# Patient Record
Sex: Female | Born: 1985 | Race: Black or African American | Hispanic: No | Marital: Single | Smoking: Former smoker
Health system: Southern US, Community
[De-identification: ages and names within clinical notes are randomized; demographics above are authoritative.]

## PROBLEM LIST (undated history)

## (undated) DIAGNOSIS — I10 Essential (primary) hypertension: Secondary | ICD-10-CM

## (undated) HISTORY — PX: COSMETIC SURGERY: SHX468

---

## 2014-10-31 DIAGNOSIS — R87619 Unspecified abnormal cytological findings in specimens from cervix uteri: Secondary | ICD-10-CM | POA: Insufficient documentation

## 2015-04-26 ENCOUNTER — Encounter (HOSPITAL_COMMUNITY): Payer: Self-pay | Admitting: *Deleted

## 2015-04-26 ENCOUNTER — Emergency Department (HOSPITAL_COMMUNITY)
Admission: EM | Admit: 2015-04-26 | Discharge: 2015-04-26 | Disposition: A | Discharge status: Home Health | Payer: Self-pay | Attending: Emergency Medicine | Admitting: Emergency Medicine

## 2015-04-26 DIAGNOSIS — W228XXA Striking against or struck by other objects, initial encounter: Secondary | ICD-10-CM | POA: Insufficient documentation

## 2015-04-26 DIAGNOSIS — Y998 Other external cause status: Secondary | ICD-10-CM | POA: Insufficient documentation

## 2015-04-26 DIAGNOSIS — Y9289 Other specified places as the place of occurrence of the external cause: Secondary | ICD-10-CM | POA: Insufficient documentation

## 2015-04-26 DIAGNOSIS — Y9389 Activity, other specified: Secondary | ICD-10-CM | POA: Insufficient documentation

## 2015-04-26 DIAGNOSIS — S81812A Laceration without foreign body, left lower leg, initial encounter: Secondary | ICD-10-CM | POA: Insufficient documentation

## 2015-04-26 DIAGNOSIS — Z720 Tobacco use: Secondary | ICD-10-CM | POA: Insufficient documentation

## 2015-04-26 MED ORDER — LIDOCAINE-EPINEPHRINE-TETRACAINE (LET) SOLUTION
3.0000 mL | Freq: Once | NASAL | Status: AC
  Administered 2015-04-26: 3 mL via TOPICAL
  Filled 2015-04-26: qty 3

## 2015-04-26 MED ORDER — LIDOCAINE HCL (PF) 1 % IJ SOLN
INTRAMUSCULAR | Status: AC
  Filled 2015-04-26: qty 5

## 2015-04-26 MED ORDER — LIDOCAINE HCL (PF) 1 % IJ SOLN
5.0000 mL | Freq: Once | INTRAMUSCULAR | Status: AC
  Administered 2015-04-26: 5 mL
  Filled 2015-04-26: qty 5

## 2015-04-26 MED ORDER — IBUPROFEN 400 MG PO TABS
600.0000 mg | ORAL_TABLET | Freq: Once | ORAL | Status: DC
  Filled 2015-04-26: qty 2

## 2015-04-26 MED ORDER — TRAMADOL HCL 50 MG PO TABS: 50.0000 mg | ORAL_TABLET | Freq: Four times a day (QID) | ORAL | Status: DC | PRN

## 2015-04-26 MED ORDER — IBUPROFEN 600 MG PO TABS: 600.0000 mg | ORAL_TABLET | Freq: Four times a day (QID) | ORAL | Status: DC | PRN

## 2015-04-26 NOTE — ED Notes (Signed)
Let applied to leg.  Pt nevous and comfort provided.  Family member at bedside

## 2015-04-26 NOTE — ED Notes (Signed)
abx ointment applied with bulky dressing.  Pt tolerated well.

## 2015-04-26 NOTE — ED Provider Notes (Signed)
CSN: [id number]     Arrival date & time 04/26/15  1356 History   First MD Initiated Contact with Patient 04/26/15 1559     Chief Complaint  Patient presents with  . Leg Injury     (Consider location/radiation/quality/duration/timing/severity/associated sxs/prior Treatment) HPI  Pt is a 29yo female presenting to ED with c/o laceration to Left lower leg after hitting it on the side of bed rail.  Pt states incident occurred about 2 hours PTA.  Bleeding controlled PTA.  No pain medication PTA, pain is aching and stinging, 6/10 at worst, 4/10 at this time. No other injuries. Tdap is UTD.  History reviewed. No pertinent past medical history. Past Surgical History  Procedure Laterality Date  . Cesarean section     No family history on file. History  Substance Use Topics  . Smoking status: Current Some Day Smoker  . Smokeless tobacco: Not on file  . Alcohol Use: No   OB History    No data available     Review of Systems  Musculoskeletal: Negative for myalgias and arthralgias.  Skin: Positive for wound ( laceration).       Left anterior lower leg  Neurological: Negative for weakness and numbness.  All other systems reviewed and are negative.     Allergies  Review of patient's allergies indicates no known allergies.  Home Medications   Prior to Admission medications   Medication Sig Start Date End Date Taking? Authorizing Provider  ibuprofen (ADVIL,MOTRIN) 600 MG tablet Take 1 tablet (600 mg total) by mouth every 6 (six) hours as needed. 04/26/15   Junius Finner, PA-C  traMADol (ULTRAM) 50 MG tablet Take 1 tablet (50 mg total) by mouth every 6 (six) hours as needed. 04/26/15   Junius Finner, PA-C   BP 116/65 mmHg  Pulse 61  Temp(Src) 98.8 F (37.1 C) (Oral)  Resp 22  SpO2 100%  LMP 04/22/2015 Physical Exam  Constitutional: She is oriented to person, place, and time. She appears well-developed and well-nourished.  HENT:  Head: Normocephalic and atraumatic.  Eyes: EOM  are normal.  Neck: Normal range of motion.  Cardiovascular: Normal rate.   Pulses:      Dorsalis pedis pulses are 2+ on the left side.  Pulmonary/Chest: Effort normal.  Musculoskeletal: Normal range of motion.  Left leg: FROM, mild tenderness around laceration (see skin exam) no calf tenderness. Compartment is soft  Neurological: She is alert and oriented to person, place, and time.  Left lower leg: sensation in tact  Skin: Skin is warm and dry.  Left lower leg, anterior aspect: 6cm vertical laceration, superficial but gaping. Scant red blood. No foreign bodies.   Psychiatric: She has a normal mood and affect. Her behavior is normal.  Nursing note and vitals reviewed.   ED Course  Procedures   LACERATION REPAIR Performed by: Junius Finner A. Authorized by: Ina Homes Consent: Verbal consent obtained. Risks and benefits: risks, benefits and alternatives were discussed Consent given by: patient Patient identity confirmed: provided demographic data Prepped and Draped in normal sterile fashion Wound explored  Laceration Location: Left anterior lower leg  Laceration Length: 6cm  No Foreign Bodies seen or palpated  Anesthesia: topical and local infiltration  Local anesthetic: LET and lidocaine 1% without epinephrine  Anesthetic total: 3 ml  Irrigation method: syringe Amount of cleaning: standard  Skin closure: 3-0 Prolene  Number of sutures: 7  Technique: interrupted  Patient tolerance: Patient tolerated the procedure well with no immediate complications.  Labs Review Labs Reviewed - No data to display  Imaging Review No results found.   EKG Interpretation None      MDM   Final diagnoses:  Leg laceration, left, initial encounter   Pt is a 29yo female presenting to ED with laceration to Left anterior leg 2 hours PTA. Tetanus UTD. Left lower extremity is neurovascularly in tact. Laceration repaired with 7 sutures. No immediate complications. Home  care instructions provided. Rx: tramadol and ibuprofen. Advised to f/u in 10-14 days for suture removal. Return precautions provided. Pt verbalized understanding and agreement with tx plan.    Junius Finnerrin O'Malley, PA-C 04/26/15 1926  Lorre NickAnthony Allen, MD 04/26/15 [phone]

## 2015-04-26 NOTE — ED Notes (Signed)
Pt is here with left leg injury that happened 2 hours ago and states she ran into a bed rail and it split her leg open. Looks superficial and last tetanus last year

## 2015-05-05 ENCOUNTER — Emergency Department (HOSPITAL_COMMUNITY)
Admission: EM | Admit: 2015-05-05 | Discharge: 2015-05-05 | Disposition: A | Discharge status: Home / Self Care | Payer: Self-pay | Attending: Emergency Medicine | Admitting: Emergency Medicine

## 2015-05-05 ENCOUNTER — Encounter (HOSPITAL_COMMUNITY): Payer: Self-pay | Admitting: *Deleted

## 2015-05-05 DIAGNOSIS — Z4802 Encounter for removal of sutures: Secondary | ICD-10-CM | POA: Insufficient documentation

## 2015-05-05 DIAGNOSIS — Z720 Tobacco use: Secondary | ICD-10-CM | POA: Insufficient documentation

## 2015-05-05 NOTE — ED Provider Notes (Addendum)
CSN: [id number]     Arrival date & time 05/05/15  1426 History  This chart was scribed for non-physician practitioner, [facility] Damian LeavellNeese, NP working with Pricilla LovelessScott Goldston, MD by Doreatha MartinEva Mathews, ED scribe. This patient was seen in room TR07C/TR07C and the patient's care was started at 2:34 PM    Chief Complaint  Patient presents with  . Suture / Staple Removal   Patient is a 29 y.o. female presenting with suture removal. The history is provided by the patient. No language interpreter was used.  Suture / Staple Removal This is a new problem. The current episode started more than 1 week ago. The problem occurs constantly. The problem has been resolved. Pertinent negatives include no abdominal pain and no headaches. Nothing aggravates the symptoms. Nothing relieves the symptoms. She has tried nothing for the symptoms. The treatment provided moderate relief.    HPI Comments: Norma Sullivan is a 29 y.o. female who presents to the Emergency Department for suture removal. Pt has 7 sutures on the left lower leg that have been in place for 11 days. Pt denies any other injuries or new symptoms. Pt also denies pain, erythema, drainage and swelling.   History reviewed. No pertinent past medical history. Past Surgical History  Procedure Laterality Date  . Cesarean section     History reviewed. No pertinent family history. History  Substance Use Topics  . Smoking status: Current Some Day Smoker  . Smokeless tobacco: Not on file  . Alcohol Use: No   OB History    No data available     Review of Systems  Gastrointestinal: Negative for abdominal pain.  Musculoskeletal: Negative for arthralgias.  Skin: Positive for wound. Negative for color change.  Neurological: Negative for headaches.  All other systems reviewed and are negative.  Allergies  Review of patient's allergies indicates no known allergies.  Home Medications   Prior to Admission medications   Medication Sig Start Date End Date  Taking? Authorizing Provider  ibuprofen (ADVIL,MOTRIN) 600 MG tablet Take 1 tablet (600 mg total) by mouth every 6 (six) hours as needed. 04/26/15   Junius FinnerErin O'Malley, PA-C  traMADol (ULTRAM) 50 MG tablet Take 1 tablet (50 mg total) by mouth every 6 (six) hours as needed. 04/26/15   Junius FinnerErin O'Malley, PA-C   BP 123/66 mmHg  Pulse 66  Temp(Src) 98.1 F (36.7 C) (Oral)  Resp 20  SpO2 100%  LMP 04/22/2015 Physical Exam  Constitutional: She is oriented to person, place, and time. She appears well-developed and well-nourished. No distress.  HENT:  Head: Normocephalic and atraumatic.  Mouth/Throat: Oropharynx is clear and moist.  Eyes: EOM are normal.  Neck: Normal range of motion. Neck supple. No tracheal deviation present.  Cardiovascular: Normal rate.   Pulmonary/Chest: Effort normal. No respiratory distress.  Musculoskeletal: Normal range of motion. She exhibits no edema or tenderness.       Legs: Sutures in place left lower leg without signs of infection.  Neurological: She is alert and oriented to person, place, and time.  Skin: Skin is warm and dry. No erythema.  No erythema no drainage. No signs of infection. Healing well.    Psychiatric: She has a normal mood and affect. Her behavior is normal.  Nursing note and vitals reviewed.   ED Course  Procedures (including critical care time)  Sutures removed by me.   DIAGNOSTIC STUDIES: Oxygen Saturation is 100% on RA, normal by my interpretation.    COORDINATION OF CARE: 2:45 PM Discussed treatment plan  with pt at bedside which includes suture removal and pt agreed to plan.   MDM  29 y.o. female here for suture removal. Stable for d/c without signs of infection.  Final diagnoses:  Visit for suture removal   I personally performed the services described in this documentation, which was scribed in my presence. The recorded information has been reviewed and is accurate.    [facility] Orlene Och, NP 05/05/15 [address] [place], [place] 05/12/15  1610  Pricilla Loveless, MD 05/13/15 [phone]

## 2015-05-05 NOTE — ED Notes (Signed)
7 sutures removed from LT anterior leg.

## 2015-05-05 NOTE — ED Notes (Signed)
Pt returns to Ed for stitch removal on Lt lower leg.

## 2015-05-05 NOTE — ED Notes (Signed)
Declined W/C at D/C and was escorted to lobby by RN. 

## 2015-05-05 NOTE — Discharge Instructions (Signed)

## 2015-09-17 ENCOUNTER — Emergency Department (HOSPITAL_COMMUNITY)
Admission: EM | Admit: 2015-09-17 | Discharge: 2015-09-17 | Disposition: A | Discharge status: Home / Self Care | Payer: Worker's Compensation | Attending: Emergency Medicine | Admitting: Emergency Medicine

## 2015-09-17 ENCOUNTER — Emergency Department (HOSPITAL_COMMUNITY): Payer: Self-pay

## 2015-09-17 ENCOUNTER — Encounter (HOSPITAL_COMMUNITY): Payer: Self-pay | Admitting: Neurology

## 2015-09-17 DIAGNOSIS — Y990 Civilian activity done for income or pay: Secondary | ICD-10-CM | POA: Insufficient documentation

## 2015-09-17 DIAGNOSIS — Y9289 Other specified places as the place of occurrence of the external cause: Secondary | ICD-10-CM | POA: Insufficient documentation

## 2015-09-17 DIAGNOSIS — W1840XA Slipping, tripping and stumbling without falling, unspecified, initial encounter: Secondary | ICD-10-CM | POA: Insufficient documentation

## 2015-09-17 DIAGNOSIS — Y9389 Activity, other specified: Secondary | ICD-10-CM | POA: Insufficient documentation

## 2015-09-17 DIAGNOSIS — S8012XA Contusion of left lower leg, initial encounter: Secondary | ICD-10-CM | POA: Insufficient documentation

## 2015-09-17 DIAGNOSIS — Z720 Tobacco use: Secondary | ICD-10-CM | POA: Insufficient documentation

## 2015-09-17 DIAGNOSIS — S80812A Abrasion, left lower leg, initial encounter: Secondary | ICD-10-CM | POA: Insufficient documentation

## 2015-09-17 MED ORDER — IBUPROFEN 600 MG PO TABS: 600.0000 mg | ORAL_TABLET | Freq: Four times a day (QID) | ORAL | Status: DC | PRN

## 2015-09-17 NOTE — ED Provider Notes (Signed)
CSN: [id number]     Arrival date & time 09/17/15  1556 History   First MD Initiated Contact with Patient 09/17/15 1637     Chief Complaint  Patient presents with  . Leg Pain     (Consider location/radiation/quality/duration/timing/severity/associated sxs/prior Treatment) HPI Norma Sullivan is a 29 y.o. female who presents to emergency department after a left leg injury. Patient states she was at work, and slipped on the washer fluid, and states her leg went under medical equipment. She reports swelling to the leg after the injury and a small abrasion. She states she is having pain with ambulating, states cannot put any weight on her leg. Patient states she put ice on her leg and elevated her leg to get the swelling down, and states she thinks it is improving. She also took ibuprofen prior to coming in. The pain continues and she is unable to ambulate secondary to emergency department for further evaluation. Denies any numbness or weakness distal to the injury.  History reviewed. No pertinent past medical history. Past Surgical History  Procedure Laterality Date  . Cesarean section     No family history on file. Social History  Substance Use Topics  . Smoking status: Current Some Day Smoker  . Smokeless tobacco: None  . Alcohol Use: No   OB History    No data available     Review of Systems  Constitutional: Negative for fever and chills.  Respiratory: Negative for cough, chest tightness and shortness of breath.   Cardiovascular: Negative for chest pain, palpitations and leg swelling.  Musculoskeletal: Positive for arthralgias. Negative for myalgias, neck pain and neck stiffness.  Skin: Positive for wound. Negative for rash.  Neurological: Negative for weakness and numbness.  All other systems reviewed and are negative.     Allergies  Review of patient's allergies indicates no known allergies.  Home Medications   Prior to Admission medications   Medication Sig  Start Date End Date Taking? Authorizing Provider  ibuprofen (ADVIL,MOTRIN) 600 MG tablet Take 1 tablet (600 mg total) by mouth every 6 (six) hours as needed. 04/26/15   Junius FinnerErin O'Malley, PA-C  traMADol (ULTRAM) 50 MG tablet Take 1 tablet (50 mg total) by mouth every 6 (six) hours as needed. 04/26/15   Junius FinnerErin O'Malley, PA-C   BP 121/64 mmHg  Pulse 73  Temp(Src) 98 F (36.7 C) (Oral)  Resp 14  SpO2 100%  LMP 08/22/2015 Physical Exam  Constitutional: She is oriented to person, place, and time. She appears well-developed and well-nourished. No distress.  HENT:  Head: Normocephalic.  Eyes: Conjunctivae are normal.  Neck: Neck supple.  Cardiovascular: Normal rate, regular rhythm and normal heart sounds.   Pulmonary/Chest: Effort normal and breath sounds normal. No respiratory distress. She has no wheezes. She has no rales.  Musculoskeletal: She exhibits no edema.  Small superficial abrasion to the distal anterior left shin. There is mild swelling around the abrasion. Tender to palpation diffusely over anterior shin. Unable to bear weight on left leg. Full range of motion of the knee and ankle. Intact. Compartments are soft.  Neurological: She is alert and oriented to person, place, and time.  Skin: Skin is warm and dry.  Psychiatric: She has a normal mood and affect. Her behavior is normal.  Nursing note and vitals reviewed.   ED Course  Procedures (including critical care time) Labs Review Labs Reviewed - No data to display  Imaging Review Dg Tibia/fibula Left  09/17/2015  CLINICAL DATA:  Anterior left  tibial pain and abrasion following fall at work. EXAM: LEFT TIBIA AND FIBULA - 2 VIEW COMPARISON:  None. FINDINGS: No fracture or dislocation. Limited visualization of the adjacent knee and ankle is normal given obliquity and large field of view. Regional soft tissues appear normal. No radiopaque foreign body. IMPRESSION: No acute findings. Electronically Signed   By: Simonne Come M.D.   On:  09/17/2015 18:01   I have personally reviewed and evaluated these images and lab results as part of my medical decision-making.   EKG Interpretation None      MDM   Final diagnoses:  Contusion of left lower leg, initial encounter    Patient with contusion to the left lower leg. X-rays negative. Small abrasion and tiny contusion noted to the shin. No obvious swelling. Full range of motion of the knee and ankle. Most likely deep contusion. No evidence of compartment syndrome. Will discharge home with ibuprofen, ice and elevation, follow up as needed.  Filed Vitals:   09/17/15 1626  BP: 121/64  Pulse: 73  Temp: 98 F (36.7 C)  TempSrc: Oral  Resp: 14  SpO2: 100%       Jaynie Crumble, PA-C 09/17/15 1809  Lorre Nick, MD 09/18/15 [phone]

## 2015-09-17 NOTE — Discharge Instructions (Signed)
Ice and elevate your leg at home. Ibuprofen for pain. Follow up as needed.    Contusion A contusion is a deep bruise. Contusions are the result of a blunt injury to tissues and muscle fibers under the skin. The injury causes bleeding under the skin. The skin overlying the contusion may turn blue, purple, or yellow. Minor injuries will give you a painless contusion, but more severe contusions may stay painful and swollen for a few weeks.  CAUSES  This condition is usually caused by a blow, trauma, or direct force to an area of the body. SYMPTOMS  Symptoms of this condition include:  Swelling of the injured area.  Pain and tenderness in the injured area.  Discoloration. The area may have redness and then turn blue, purple, or yellow. DIAGNOSIS  This condition is diagnosed based on a physical exam and medical history. An X-ray, CT scan, or MRI may be needed to determine if there are any associated injuries, such as broken bones (fractures). TREATMENT  Specific treatment for this condition depends on what area of the body was injured. In general, the best treatment for a contusion is resting, icing, applying pressure to (compression), and elevating the injured area. This is often called the RICE strategy. Over-the-counter anti-inflammatory medicines may also be recommended for pain control.  HOME CARE INSTRUCTIONS   Rest the injured area.  If directed, apply ice to the injured area:  Put ice in a plastic bag.  Place a towel between your skin and the bag.  Leave the ice on for 20 minutes, 2-3 times per day.  If directed, apply light compression to the injured area using an elastic bandage. Make sure the bandage is not wrapped too tightly. Remove and reapply the bandage as directed by your health care provider.  If possible, raise (elevate) the injured area above the level of your heart while you are sitting or lying down.  Take over-the-counter and prescription medicines only as told by  your health care provider. SEEK MEDICAL CARE IF:  Your symptoms do not improve after several days of treatment.  Your symptoms get worse.  You have difficulty moving the injured area. SEEK IMMEDIATE MEDICAL CARE IF:   You have severe pain.  You have numbness in a hand or foot.  Your hand or foot turns pale or cold.   This information is not intended to replace advice given to you by your health care provider. Make sure you discuss any questions you have with your health care provider.   Document Released: 08/24/2005 Document Revised: 08/05/2015 Document Reviewed: 04/01/2015 Elsevier Interactive Patient Education [employer]2016 Elsevier Inc.

## 2015-09-17 NOTE — ED Notes (Signed)
Pt reports was at work, tripped and hit her lower left leg on a metal walkway. Superficial abrasion noted. Is ambulatory.

## 2015-09-17 NOTE — ED Notes (Signed)
Pt stable, ambulatory, states understanding of discharge instructions 

## 2016-03-25 ENCOUNTER — Emergency Department (HOSPITAL_COMMUNITY): Payer: PRIVATE HEALTH INSURANCE

## 2016-03-25 ENCOUNTER — Encounter (HOSPITAL_COMMUNITY): Payer: Self-pay | Admitting: Emergency Medicine

## 2016-03-25 ENCOUNTER — Emergency Department (HOSPITAL_COMMUNITY)
Admission: EM | Admit: 2016-03-25 | Discharge: 2016-03-25 | Disposition: A | Discharge status: Home / Self Care | Payer: PRIVATE HEALTH INSURANCE | Attending: Emergency Medicine | Admitting: Emergency Medicine

## 2016-03-25 DIAGNOSIS — Y9289 Other specified places as the place of occurrence of the external cause: Secondary | ICD-10-CM | POA: Insufficient documentation

## 2016-03-25 DIAGNOSIS — Y990 Civilian activity done for income or pay: Secondary | ICD-10-CM | POA: Diagnosis not present

## 2016-03-25 DIAGNOSIS — S61312A Laceration without foreign body of right middle finger with damage to nail, initial encounter: Secondary | ICD-10-CM | POA: Diagnosis not present

## 2016-03-25 DIAGNOSIS — S6991XA Unspecified injury of right wrist, hand and finger(s), initial encounter: Secondary | ICD-10-CM | POA: Diagnosis present

## 2016-03-25 DIAGNOSIS — Y9389 Activity, other specified: Secondary | ICD-10-CM | POA: Diagnosis not present

## 2016-03-25 DIAGNOSIS — S60131A Contusion of right middle finger with damage to nail, initial encounter: Secondary | ICD-10-CM | POA: Diagnosis not present

## 2016-03-25 DIAGNOSIS — F17200 Nicotine dependence, unspecified, uncomplicated: Secondary | ICD-10-CM | POA: Diagnosis not present

## 2016-03-25 DIAGNOSIS — W230XXA Caught, crushed, jammed, or pinched between moving objects, initial encounter: Secondary | ICD-10-CM | POA: Insufficient documentation

## 2016-03-25 DIAGNOSIS — Z23 Encounter for immunization: Secondary | ICD-10-CM | POA: Diagnosis not present

## 2016-03-25 DIAGNOSIS — S60031A Contusion of right middle finger without damage to nail, initial encounter: Secondary | ICD-10-CM

## 2016-03-25 HISTORY — DX: Essential (primary) hypertension: I10

## 2016-03-25 MED ORDER — BUPIVACAINE HCL (PF) 0.5 % IJ SOLN
10.0000 mL | Freq: Once | INTRAMUSCULAR | Status: AC
  Administered 2016-03-25: 10 mL
  Filled 2016-03-25: qty 10

## 2016-03-25 MED ORDER — TETANUS-DIPHTH-ACELL PERTUSSIS 5-2.5-18.5 LF-MCG/0.5 IM SUSP
0.5000 mL | Freq: Once | INTRAMUSCULAR | Status: AC
  Administered 2016-03-25: 0.5 mL via INTRAMUSCULAR
  Filled 2016-03-25: qty 0.5

## 2016-03-25 MED ORDER — NAPROXEN 250 MG PO TABS
500.0000 mg | ORAL_TABLET | Freq: Once | ORAL | Status: AC
  Administered 2016-03-25: 500 mg via ORAL
  Filled 2016-03-25: qty 2

## 2016-03-25 MED ORDER — NAPROXEN 500 MG PO TABS: 500.0000 mg | ORAL_TABLET | Freq: Two times a day (BID) | ORAL | Status: DC

## 2016-03-25 NOTE — Discharge Instructions (Signed)
Please call Dr. Glenna Durand office to schedule a hand/orthopedic follow up appointment. In the meantime you may take naproxen as needed for pain. Keep the area clean with warm water and soap. Return to the ER for new or worsening symptoms.   Nail Bed Laceration Nail bed lacerations are cuts or breaks in the soft tissue under a fingernail or toenail. These injuries are usually painful and cause bleeding. They often involve damage to the nail or loss of the nail. If the nail remains in place, a nail bed laceration may result in a painful collection of blood under the nail (hematoma).  CAUSES Nail bed lacerations are commonly caused by:   Crush or pinch injuries in which the finger or toe gets caught between two objects.  A sharp cut to the fingertip or toe.  Tearing injuries (avulsions) to the tip of the finger or toe. DIAGNOSIS Your health care provider will take a medical history, ask for details about how the injury occurred, and examine the injured area. The wound will be cleaned so that the health care provider can see the extent of the injury. X-rays are sometimes done to check for broken bones. TREATMENT Treatment will depend on the severity of the laceration and the details of the injury. Most lacerations require repair with stitches (sutures). For this repair, your health care provider may:  Give you medicine to numb the injured area (local anesthetic). In some cases, the health care provider may also apply a compression bandage (tourniquet) to reduce bleeding.  Remove the nail from the nail bed.  Close the wound in the nail bed with sutures that dissolve on their own (absorbable sutures).  Reattach the nail (if intact) after repairing the nail bed. The nail may be held in place with sutures or skin glue. Sometimes, a small hole is made in the nail to allow for normal drainage of fluid or blood. If the nail is lost or too damaged, a small piece of silicone or special gauze may be put  over the nail bed.  A bandage (dressing) may be applied over the wound. A splint is sometimes used for increased protection. You may need a tetanus shot if:  You cannot remember when you had your last tetanus shot.   You have never had a tetanus shot.   The injury broke your skin. If you get a tetanus shot, your arm may swell, get red, and feel warm to the touch. This is common and not a problem. If you need a tetanus shot and you choose not to have one, there is a rare chance of getting tetanus. Sickness from tetanus can be serious. HOME CARE INSTRUCTIONS  Keep the wound clean and dry. Change or remove dressings only as directed by your health care provider. If the nail was preserved, you may be asked not to change the original dressing for 5 to 7 days. If the nail was lost, you may need to change dressings much sooner.   Gently clean the wound with soap and water before putting on a new dressing.   Apply antibiotic ointment to the wound if advised to do so by your health care provider.   Move your hand or foot normally to prevent stiffness. Your health care provider may give you specific instructions.   Only take over-the-counter or prescription medicines as directed by your health care provider. If you were prescribed antibiotics, take them as directed. Finish them even if you start to feel better.  Follow up with  your health care provider as directed. SEEK MEDICAL CARE IF:  You have redness, swelling, or increasing pain in the injured area.   You notice fluid draining from the injured site.  SEEK IMMEDIATE MEDICAL CARE IF:  Your wound splits open and starts bleeding.   Your skin around the injury is turning dark blue or black.  MAKE SURE YOU:  Understand these instructions.   Will watch your condition.   Will get help right away if you are not doing well or get worse.    This information is not intended to replace advice given to you by your health care  provider. Make sure you discuss any questions you have with your health care provider.   Document Released: 02/21/2008 Document Revised: 07/17/2013 Document Reviewed: 04/29/2013 Elsevier Interactive Patient Education [employer]2016 Elsevier Inc.

## 2016-03-25 NOTE — ED Notes (Signed)
Pt. States she was at work and her finger got stuck under weight of a cutting machine. Pt. Appears to have crush injury to end of right middle finger. Pt. Is able to move and bend all fingers at this time.

## 2016-03-25 NOTE — ED Notes (Signed)
Splint applied by emt.

## 2016-03-25 NOTE — ED Notes (Signed)
Provider at the bedside for update.

## 2016-03-25 NOTE — ED Provider Notes (Signed)
CSN: [id number]     Arrival date & time 03/25/16  1957 History  By signing my name below, I, Placido SouLogan Joldersma, attest that this documentation has been prepared under the direction and in the presence of Kiondra Caicedo Y Chrisie Jankovich, [place]-C. Electronically Signed: Placido SouLogan Joldersma, ED Scribe. 03/25/2016. 8:41 PM.   Chief Complaint  Patient presents with  . Finger Injury   The history is provided by the patient. No language interpreter was used.    HPI Comments: Norma Sullivan is a 30 y.o. female who presents to the Emergency Department complaining of a laceration with controlled bleeding to her right middle finger which occurred PTA. 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 She reports associated, moderate, pain surrounding the wound that worsens with any movement. She is unsure of the timing of her most recent tetanus vaccination. Pt denies any other associated symptoms at this time.   No past medical history on file. Past Surgical History  Procedure Laterality Date  . Cesarean section     No family history on file. Social History  Substance Use Topics  . Smoking status: Current Some Day Smoker  . Smokeless tobacco: Not on file  . Alcohol Use: No   OB History    No data available     Review of Systems A complete 10 system review of systems was obtained and all systems are negative except as noted in the HPI and PMH.   Allergies  Review of patient's allergies indicates no known allergies.  Home Medications   Prior to Admission medications   Medication Sig Start Date End Date Taking? Authorizing Provider  ibuprofen (ADVIL,MOTRIN) 600 MG tablet Take 1 tablet (600 mg total) by mouth every 6 (six) hours as needed. 04/26/15   Junius FinnerErin O'Malley, PA-C  ibuprofen (ADVIL,MOTRIN) 600 MG tablet Take 1 tablet (600 mg total) by mouth every 6 (six) hours as needed. 09/17/15   Tatyana Kirichenko, PA-C  traMADol (ULTRAM)  50 MG tablet Take 1 tablet (50 mg total) by mouth every 6 (six) hours as needed. 04/26/15   Junius FinnerErin O'Malley, PA-C   BP 134/85 mmHg  Pulse 92  Temp(Src) 98.4 F (36.9 C) (Oral)  Resp 16  SpO2 100%  LMP 02/22/2016    Physical Exam  Constitutional: She is oriented to person, place, and time. She appears well-developed and well-nourished.  HENT:  Head: Normocephalic and atraumatic.  Eyes: EOM are normal.  Neck: Normal range of motion.  Cardiovascular: Normal rate.   Pulmonary/Chest: Effort normal. No respiratory distress.  Abdominal: Soft.  Musculoskeletal: Normal range of motion.  Longitudinal laceration to dorsal distal tip of right middle finger. Laceration extends through artificial nail, natural nail, though not through volar side of hand. Some limited ROM due to pain. Intact sensation. Brisk cap refill. Bleeding well controlled. No foreign bodies visualized or palpated.  Neurological: She is alert and oriented to person, place, and time.  Skin: Skin is warm and dry.  Psychiatric: She has a normal mood and affect.  Nursing note and vitals reviewed.   ED Course  Procedures   NERVE BLOCK Performed by: Noelle PennerSerena Joh Rao Consent: Verbal consent obtained. Required items: required blood products, implants, devices, and special equipment available Time out: Immediately prior to procedure a "time out" was called to verify the correct patient, procedure, equipment, support staff and site/side marked as required.  Indication: finger lac Nerve block body site: right middle finger digital  block  Preparation: Patient was prepped and draped in the usual sterile fashion. Needle gauge: 24 G Location technique: anatomical landmarks  Local anesthetic: 0.5% marcaine without epi  Anesthetic total: 5 ml  Outcome: pain improved Patient tolerance: Patient tolerated the procedure well with no immediate complications.   DIAGNOSTIC STUDIES: Oxygen Saturation is 100% on RA, normal by my  interpretation.    COORDINATION OF CARE: 8:21 PM Discussed next steps with pt. She verbalized understanding and is agreeable with the plan.   Labs Review Labs Reviewed - No data to display  Imaging Review Dg Finger Middle Right  03/25/2016  CLINICAL DATA:  Fingertip laceration at work today. Initial encounter. EXAM: RIGHT MIDDLE FINGER 2+V COMPARISON:  None. FINDINGS: Fracture of the nail. No osseous fracture or subluxation. No opaque foreign body. IMPRESSION: No osseous abnormality or opaque foreign body. Electronically Signed   By: Marnee Spring M.D.   On: 03/25/2016 21:04   I have personally reviewed and evaluated these images as part of my medical decision-making.   EKG Interpretation None      MDM   Final diagnoses:  Contusion of right middle finger, initial encounter  Laceration of right middle finger w/o foreign body with damage to nail, initial encounter    X-ray reveals fracture of finger nail but no other acute fracture or foreign body. Laceration is through nail and will hold off on any kind of suture repair. Discussed with pt she might lose her fingernail. Tetanus updated. Pt otherwise neurovascularly intact and stable for discharge with outpatient f/u. Pt placed in finger splint and rx given for naproxen. Instructed to f/u with hand. ER return precations given.   I personally performed the services described in this documentation, which was scribed in my presence. The recorded information has been reviewed and is accurate.   Carlene Coria, PA-C 03/25/16 2147

## 2016-05-01 ENCOUNTER — Emergency Department (HOSPITAL_COMMUNITY)
Admission: EM | Admit: 2016-05-01 | Discharge: 2016-05-01 | Disposition: A | Discharge status: Home / Self Care | Payer: PRIVATE HEALTH INSURANCE | Attending: Emergency Medicine | Admitting: Emergency Medicine

## 2016-05-01 ENCOUNTER — Emergency Department (HOSPITAL_COMMUNITY): Payer: PRIVATE HEALTH INSURANCE

## 2016-05-01 ENCOUNTER — Encounter (HOSPITAL_COMMUNITY): Payer: Self-pay | Admitting: *Deleted

## 2016-05-01 DIAGNOSIS — S82891A Other fracture of right lower leg, initial encounter for closed fracture: Secondary | ICD-10-CM | POA: Diagnosis not present

## 2016-05-01 DIAGNOSIS — Y999 Unspecified external cause status: Secondary | ICD-10-CM | POA: Diagnosis not present

## 2016-05-01 DIAGNOSIS — I10 Essential (primary) hypertension: Secondary | ICD-10-CM | POA: Insufficient documentation

## 2016-05-01 DIAGNOSIS — F17200 Nicotine dependence, unspecified, uncomplicated: Secondary | ICD-10-CM | POA: Insufficient documentation

## 2016-05-01 DIAGNOSIS — S99911A Unspecified injury of right ankle, initial encounter: Secondary | ICD-10-CM | POA: Diagnosis present

## 2016-05-01 DIAGNOSIS — S93401A Sprain of unspecified ligament of right ankle, initial encounter: Secondary | ICD-10-CM

## 2016-05-01 DIAGNOSIS — Y9389 Activity, other specified: Secondary | ICD-10-CM | POA: Insufficient documentation

## 2016-05-01 DIAGNOSIS — W07XXXA Fall from chair, initial encounter: Secondary | ICD-10-CM | POA: Diagnosis not present

## 2016-05-01 DIAGNOSIS — Y929 Unspecified place or not applicable: Secondary | ICD-10-CM | POA: Insufficient documentation

## 2016-05-01 MED ORDER — IBUPROFEN 400 MG PO TABS
600.0000 mg | ORAL_TABLET | Freq: Once | ORAL | Status: AC
  Administered 2016-05-01: 600 mg via ORAL
  Filled 2016-05-01: qty 1

## 2016-05-01 MED ORDER — HYDROMORPHONE HCL 1 MG/ML IJ SOLN
1.0000 mg | Freq: Once | INTRAMUSCULAR | Status: DC
  Filled 2016-05-01: qty 1

## 2016-05-01 MED ORDER — OXYCODONE-ACETAMINOPHEN 5-325 MG PO TABS: 2.0000 | ORAL_TABLET | ORAL | Status: DC | PRN

## 2016-05-01 NOTE — ED Notes (Signed)
K Gekas, PA, in w/pt. 

## 2016-05-01 NOTE — ED Notes (Signed)
States pain increased after attempted to ambulate w/cam walker and crutches. States feels will decrease when gets home.

## 2016-05-01 NOTE — ED Notes (Signed)
Anklet removed from right ankle - given to pt.

## 2016-05-01 NOTE — ED Notes (Signed)
States fell out of a chair attempting to hang curtains injurying right ankle. Denies head injury/LOC. States Ibuprofen last pm for pain.

## 2016-05-01 NOTE — Progress Notes (Signed)
Orthopedic Tech Progress Note Patient Details:  Charlies SilversMarquaitta D Ashraf 08/08/1986 [id number]  Ortho Devices Type of Ortho Device: Crutches, CAM walker Ortho Device/Splint Interventions: Application   Saul FordyceJennifer C Aaryn Parrilla 05/01/2016, 11:04 AM

## 2016-05-01 NOTE — ED Notes (Signed)
Kids x 3 w/pt.

## 2016-05-01 NOTE — ED Provider Notes (Signed)
CSN: [id number]     Arrival date & time 05/01/16  0905 History  By signing my name below, I, Soijett Blue, attest that this documentation has been prepared under the direction and in the presence of Bethel Born, PA-C Electronically Signed: Soijett Blue, ED Scribe. 05/01/2016. 10:03 AM.   Chief Complaint  Patient presents with  . Fall    right ankle injury    The history is provided by the patient. No language interpreter was used.   Norma Sullivan is a 30 y.o. female with a medical hx of HTN who presents to the Emergency Department complaining of a fall onset last night. Pt notes that she fell out of a chair while standing on it and attempting to hang curtains onto her right ankle. Pt reports that she heard cracking during her fall. Pt denies injuring her right ankle in the past. Pt is having associated symptoms of right ankle pain, gait problem due to pain, and right ankle swelling. She notes that she has tried ibuprofen with her last dose being last night for the relief of her symptoms. She denies right knee pain, numbness, tingling, and any other symptoms. 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    Past Medical History  Diagnosis Date  . Hypertension    Past Surgical History  Procedure Laterality Date  . Cesarean section     No family history on file. Social History  Substance Use Topics  . Smoking status: Current Some Day Smoker  . Smokeless tobacco: Not on file  . Alcohol Use: No   OB History    No data available     Review of Systems  Musculoskeletal: Positive for joint swelling, arthralgias (right ankle) and gait problem (due to pain).  Skin: Negative for color change, rash and wound.  Neurological: Negative for numbness.       No tingling     Allergies  Review of patient's allergies indicates no known allergies.  Home Medications   Prior to Admission medications   Medication Sig Start Date End Date  Taking? Authorizing Provider  ibuprofen (ADVIL,MOTRIN) 600 MG tablet Take 1 tablet (600 mg total) by mouth every 6 (six) hours as needed. 04/26/15   Junius Finner, PA-C  ibuprofen (ADVIL,MOTRIN) 600 MG tablet Take 1 tablet (600 mg total) by mouth every 6 (six) hours as needed. 09/17/15   Tatyana Kirichenko, PA-C  naproxen (NAPROSYN) 500 MG tablet Take 1 tablet (500 mg total) by mouth 2 (two) times daily. 03/25/16   Ace Gins Sam, PA-C  traMADol (ULTRAM) 50 MG tablet Take 1 tablet (50 mg total) by mouth every 6 (six) hours as needed. 04/26/15   Junius Finner, PA-C   BP 120/90 mmHg  Pulse 87  Temp(Src) 99 F (37.2 C) (Oral)  Resp 14  SpO2 96%  LMP 03/28/2016 (Approximate)  \ Physical Exam  Constitutional: She is oriented to person, place, and time. She appears well-developed and well-nourished. No distress.  HENT:  Head: Normocephalic and atraumatic.  Eyes: Conjunctivae are normal. Pupils are equal, round, and reactive to light. Right eye exhibits no discharge. Left eye exhibits no discharge. No scleral icterus.  Neck: Normal range of motion.  Cardiovascular: Normal rate.   Pulmonary/Chest: Effort normal. No respiratory distress.  Abdominal: She exhibits no distension.  Musculoskeletal:       Right knee: Normal.       Right ankle: She exhibits swelling and ecchymosis. Tenderness.  Right foot: Normal.  Right ankle: obvious swelling over lateral aspect of right ankle with minimal amount of bruising. Diffusely tender to entire ankle. Non-tender over dorsal aspect of plantar aspect of foot. Non-tender to right foot. Non-tender over right calf. ROM deferred due to pain. NVI. Gait deferred due to pain.   Neurological: She is alert and oriented to person, place, and time.  Skin: Skin is warm and dry.  Psychiatric: She has a normal mood and affect. Her behavior is normal.  Nursing note and vitals reviewed.   ED Course  Procedures (including critical care time) DIAGNOSTIC STUDIES: Oxygen  Saturation is 96% on RA, nl by my interpretation.    COORDINATION OF CARE: 10:03 AM Discussed treatment plan with pt at bedside which includes right ankle xray, ice, crutches, and pt agreed to plan.  Labs Review Labs Reviewed - No data to display  Imaging Review Dg Ankle Complete Right  05/01/2016  CLINICAL DATA:  Acute right ankle pain and swelling following twisting injury yesterday. Initial encounter. EXAM: RIGHT ANKLE - COMPLETE 3+ VIEW COMPARISON:  None. FINDINGS: A small bony density between the fibular tip and lateral talus identified and may represent small avulsion fracture. No other fracture, subluxation or dislocation identified. Lateral soft tissue swelling is noted. No focal bony lesions are present. IMPRESSION: Small bony density between the fibular tip and lateral talus suspicious for a small avulsion fracture. Electronically Signed   By: Harmon PierJeffrey  Hu M.D.   On: 05/01/2016 10:27   I have personally reviewed and evaluated these images as part of my medical decision-making.   EKG Interpretation None      MDM   Final diagnoses:  Ankle sprain, right, initial encounter  Avulsion fracture of ankle, right, closed, initial encounter   Patient X-Ray positive for small bony density between the fibular tip and lateral talus suspicious for a small avulsion fracture. Pt advised to follow up with orthopedics. Pain medication offered however patient declined. Patient given cam walker boot and crutches while in ED, conservative therapy recommended and discussed. Patient will be discharged home & is agreeable with above plan. Returns precautions discussed. Pt appears safe for discharge.  I personally performed the services described in this documentation, which was scribed in my presence. The recorded information has been reviewed and is accurate.    Bethel BornKelly Marie Lurlie Wigen, PA-C 05/02/16 [id number]  Arby BarretteMarcy Pfeiffer, MD 05/02/16 [phone]

## 2016-05-01 NOTE — Discharge Instructions (Signed)
Ankle Sprain  An ankle sprain is an injury to the strong, fibrous tissues (ligaments) that hold the bones of your ankle joint together.   CAUSES  An ankle sprain is usually caused by a fall or by twisting your ankle. Ankle sprains most commonly occur when you step on the outer edge of your foot, and your ankle turns inward. People who participate in sports are more prone to these types of injuries.   SYMPTOMS    Pain in your ankle. The pain may be present at rest or only when you are trying to stand or walk.   Swelling.   Bruising. Bruising may develop immediately or within 1 to 2 days after your injury.   Difficulty standing or walking, particularly when turning corners or changing directions.  DIAGNOSIS   Your caregiver will ask you details about your injury and perform a physical exam of your ankle to determine if you have an ankle sprain. During the physical exam, your caregiver will press on and apply pressure to specific areas of your foot and ankle. Your caregiver will try to move your ankle in certain ways. An X-ray exam may be done to be sure a bone was not broken or a ligament did not separate from one of the bones in your ankle (avulsion fracture).   TREATMENT   Certain types of braces can help stabilize your ankle. Your caregiver can make a recommendation for this. Your caregiver may recommend the use of medicine for pain. If your sprain is severe, your caregiver may refer you to a surgeon who helps to restore function to parts of your skeletal system (orthopedist) or a physical therapist.  HOME CARE INSTRUCTIONS    Apply ice to your injury for 1-2 days or as directed by your caregiver. Applying ice helps to reduce inflammation and pain.    Put ice in a plastic bag.    Place a towel between your skin and the bag.    Leave the ice on for 15-20 minutes at a time, every 2 hours while you are awake.   Only take over-the-counter or prescription medicines for pain, discomfort, or fever as directed by  your caregiver.   Elevate your injured ankle above the level of your heart as much as possible for 2-3 days.   If your caregiver recommends crutches, use them as instructed. Gradually put weight on the affected ankle. Continue to use crutches or a cane until you can walk without feeling pain in your ankle.   If you have a plaster splint, wear the splint as directed by your caregiver. Do not rest it on anything harder than a pillow for the first 24 hours. Do not put weight on it. Do not get it wet. You may take it off to take a shower or bath.   You may have been given an elastic bandage to wear around your ankle to provide support. If the elastic bandage is too tight (you have numbness or tingling in your foot or your foot becomes cold and blue), adjust the bandage to make it comfortable.   If you have an air splint, you may blow more air into it or let air out to make it more comfortable. You may take your splint off at night and before taking a shower or bath. Wiggle your toes in the splint several times per day to decrease swelling.  SEEK MEDICAL CARE IF:    You have rapidly increasing bruising or swelling.   Your toes feel   extremely cold or you lose feeling in your foot.   Your pain is not relieved with medicine.  SEEK IMMEDIATE MEDICAL CARE IF:   Your toes are numb or blue.   You have severe pain that is increasing.  MAKE SURE YOU:    Understand these instructions.   Will watch your condition.   Will get help right away if you are not doing well or get worse.     This information is not intended to replace advice given to you by your health care provider. Make sure you discuss any questions you have with your health care provider.     Document Released: 11/14/2005 Document Revised: 12/05/2014 Document Reviewed: 11/26/2011  Elsevier Interactive Patient Education 2016 [employer]

## 2016-05-01 NOTE — ED Notes (Signed)
Pt refused Dilaudid after attempting to decide for several minutes. Ortho Tech in w/pt.

## 2016-05-23 ENCOUNTER — Telehealth (HOSPITAL_BASED_OUTPATIENT_CLINIC_OR_DEPARTMENT_OTHER): Payer: Self-pay | Admitting: Emergency Medicine

## 2016-06-09 NOTE — ED Provider Notes (Signed)
Medical screening examination/treatment/procedure(s) were performed by non-physician practitioner and as supervising physician I was immediately available for consultation/collaboration.   EKG Interpretation None       Jacalyn LefevreJulie July Linam, MD 06/09/16 1254

## 2016-08-06 IMAGING — DX DG ANKLE COMPLETE 3+V*R*
3 series · 3 of 3 positions shown · non-contrast
Comparison: None.

CLINICAL DATA: Acute right ankle pain and swelling following
twisting injury yesterday. Initial encounter.

EXAM:
RIGHT ANKLE - COMPLETE 3+ VIEW

[x ankle ap right]
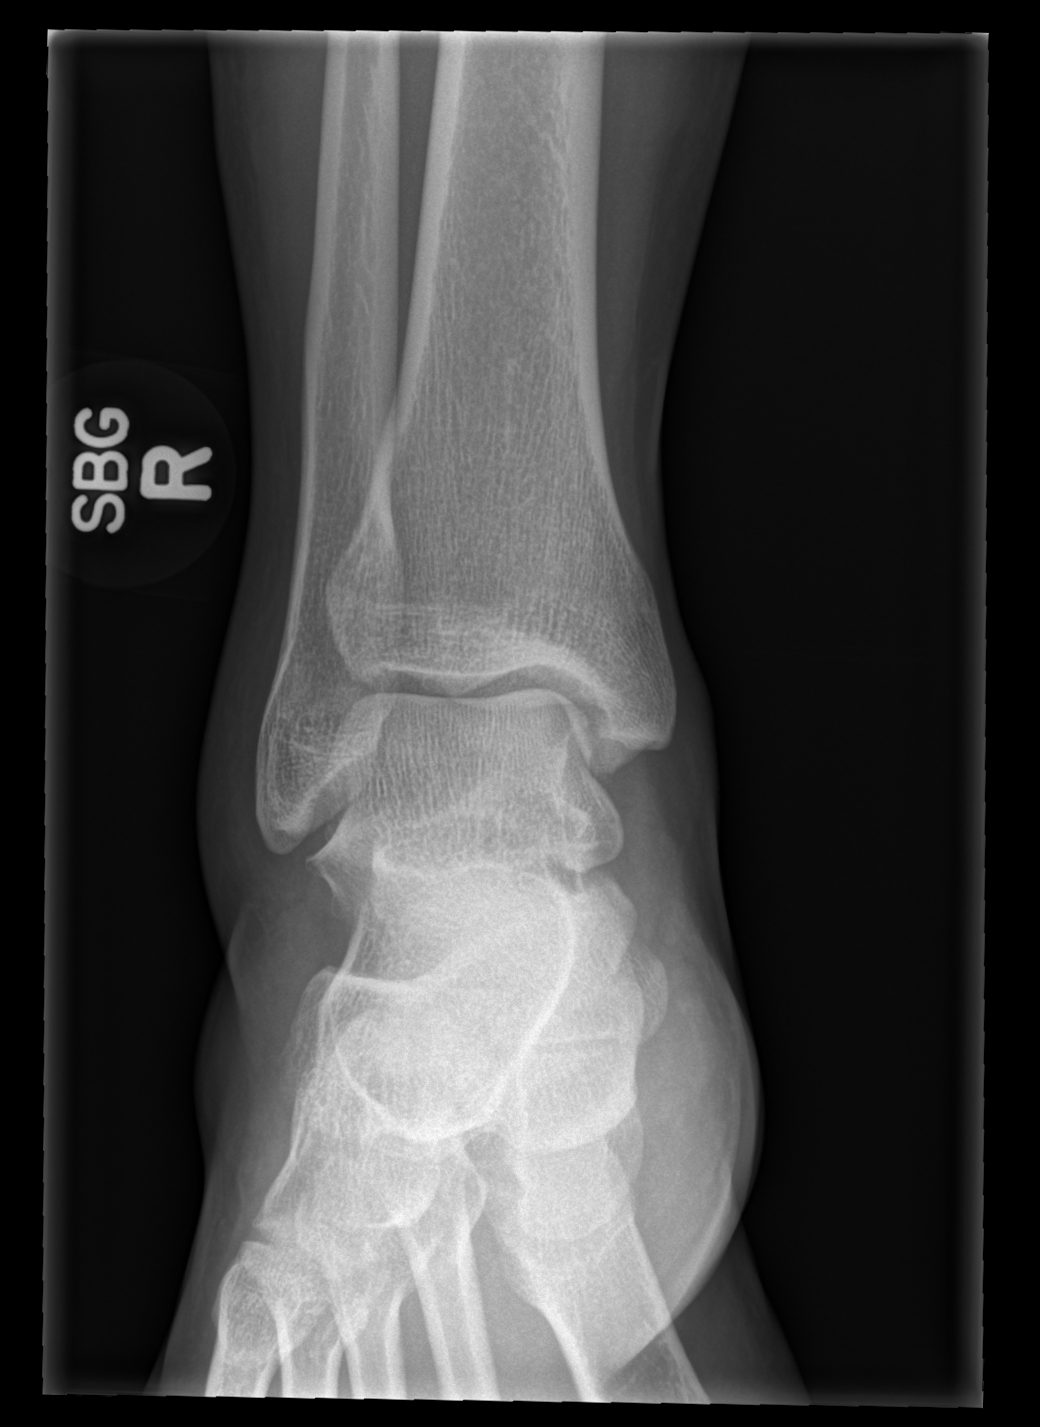

[x ankle obl right]
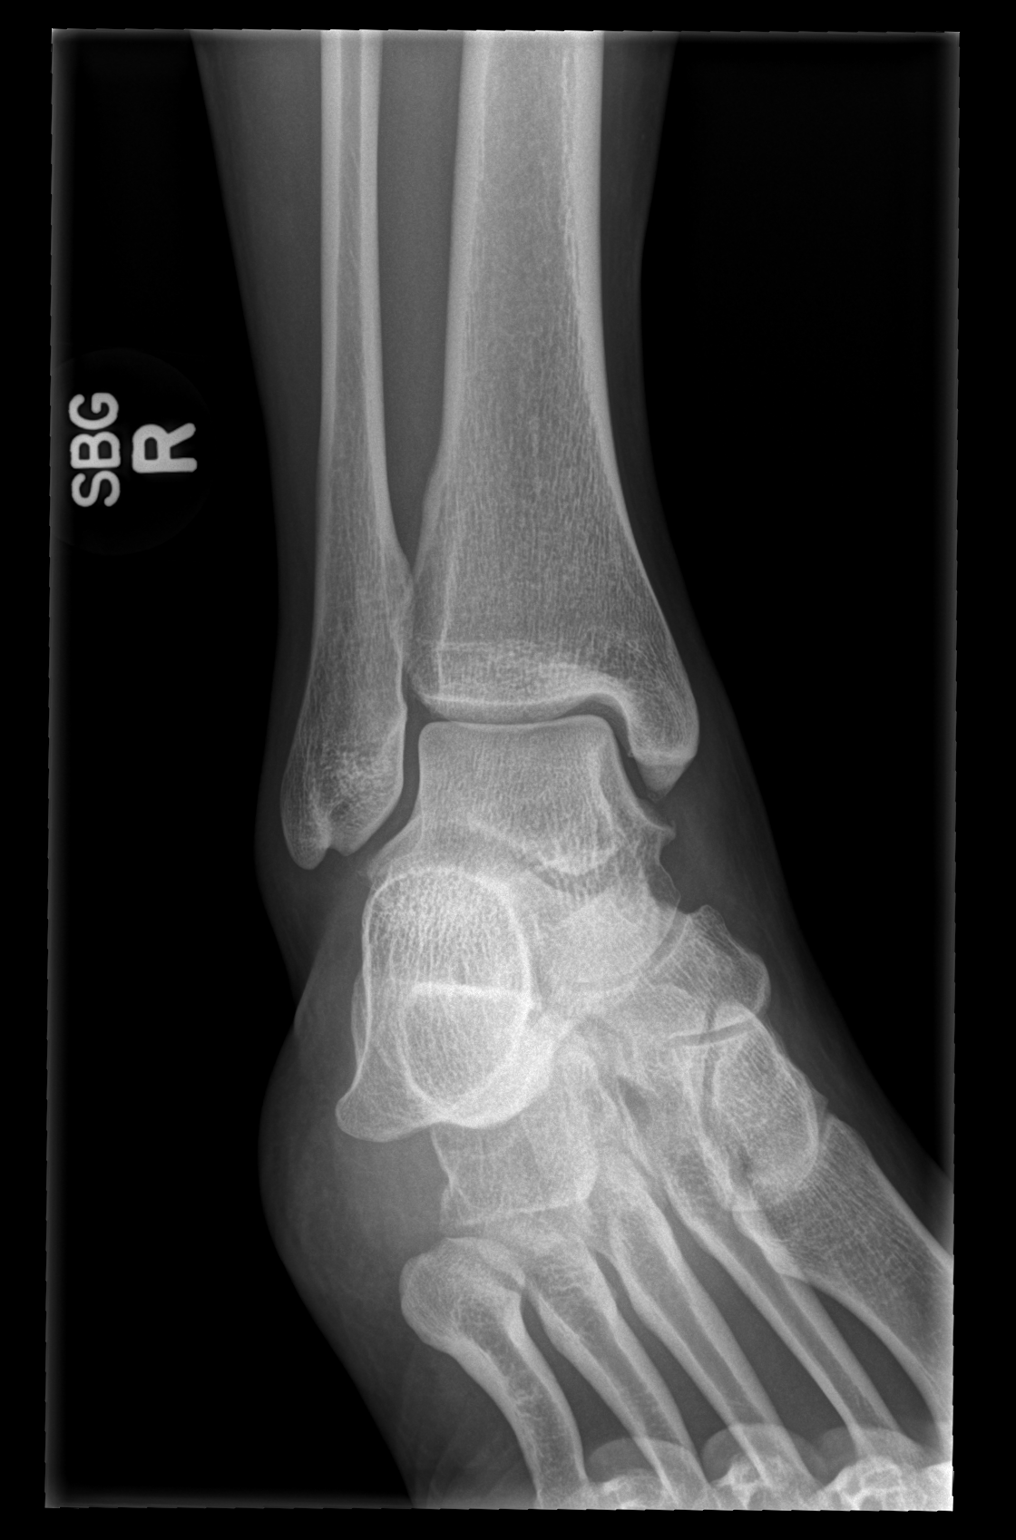

[x ankle lat right]
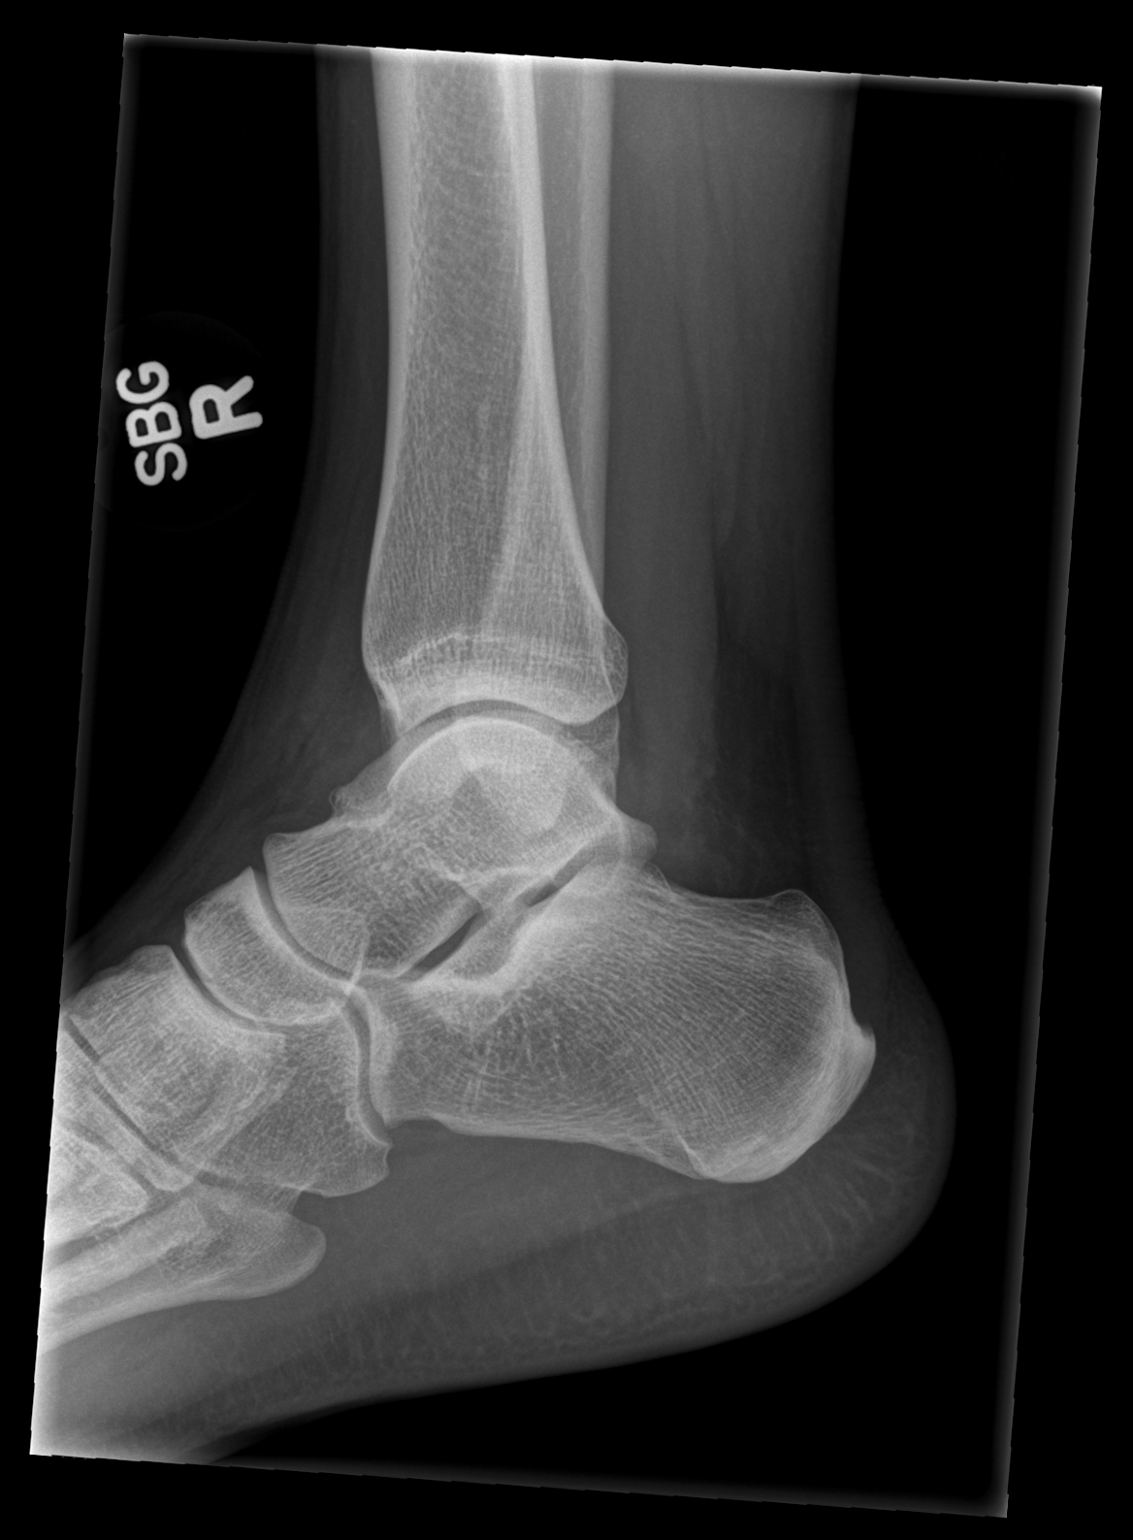

[3 of 3 positions shown; findings below may reference images not displayed]

FINDINGS: A small bony density between the fibular tip and lateral talus
identified and may represent small avulsion fracture.

No other fracture, subluxation or dislocation identified.

Lateral soft tissue swelling is noted.

No focal bony lesions are present.
IMPRESSION: Small bony density between the fibular tip and lateral talus
suspicious for a small avulsion fracture.

## 2017-02-18 ENCOUNTER — Encounter (HOSPITAL_COMMUNITY): Payer: Self-pay | Admitting: Emergency Medicine

## 2017-02-18 ENCOUNTER — Emergency Department (HOSPITAL_COMMUNITY): Payer: PRIVATE HEALTH INSURANCE

## 2017-02-18 ENCOUNTER — Emergency Department (HOSPITAL_COMMUNITY)
Admission: EM | Admit: 2017-02-18 | Discharge: 2017-02-18 | Disposition: A | Discharge status: Home / Self Care | Payer: PRIVATE HEALTH INSURANCE | Attending: Emergency Medicine | Admitting: Emergency Medicine

## 2017-02-18 DIAGNOSIS — S3992XA Unspecified injury of lower back, initial encounter: Secondary | ICD-10-CM | POA: Diagnosis present

## 2017-02-18 DIAGNOSIS — S300XXA Contusion of lower back and pelvis, initial encounter: Secondary | ICD-10-CM | POA: Diagnosis not present

## 2017-02-18 DIAGNOSIS — M5442 Lumbago with sciatica, left side: Secondary | ICD-10-CM | POA: Insufficient documentation

## 2017-02-18 DIAGNOSIS — Y92410 Unspecified street and highway as the place of occurrence of the external cause: Secondary | ICD-10-CM | POA: Diagnosis not present

## 2017-02-18 DIAGNOSIS — Y999 Unspecified external cause status: Secondary | ICD-10-CM | POA: Insufficient documentation

## 2017-02-18 DIAGNOSIS — Y939 Activity, unspecified: Secondary | ICD-10-CM | POA: Insufficient documentation

## 2017-02-18 DIAGNOSIS — I10 Essential (primary) hypertension: Secondary | ICD-10-CM | POA: Insufficient documentation

## 2017-02-18 DIAGNOSIS — Z87891 Personal history of nicotine dependence: Secondary | ICD-10-CM | POA: Insufficient documentation

## 2017-02-18 DIAGNOSIS — T07XXXA Unspecified multiple injuries, initial encounter: Secondary | ICD-10-CM

## 2017-02-18 LAB — POC URINE PREG, ED: PREG TEST UR: NEGATIVE

## 2017-02-18 MED ORDER — IBUPROFEN 600 MG PO TABS: 600.0000 mg | ORAL_TABLET | Freq: Four times a day (QID) | ORAL | 0 refills | Status: DC | PRN

## 2017-02-18 MED ORDER — DIAZEPAM 5 MG PO TABS
5.0000 mg | ORAL_TABLET | Freq: Once | ORAL | Status: AC
  Administered 2017-02-18: 5 mg via ORAL
  Filled 2017-02-18: qty 1

## 2017-02-18 MED ORDER — TIZANIDINE HCL 2 MG PO TABS: 2.0000 mg | ORAL_TABLET | Freq: Four times a day (QID) | ORAL | 0 refills | Status: DC | PRN

## 2017-02-18 MED ORDER — IBUPROFEN 200 MG PO TABS
600.0000 mg | ORAL_TABLET | Freq: Once | ORAL | Status: AC
  Administered 2017-02-18: 600 mg via ORAL
  Filled 2017-02-18: qty 3

## 2017-02-18 NOTE — ED Notes (Signed)
Patient walked to bathroom.
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
## 2018-02-11 ENCOUNTER — Encounter (HOSPITAL_COMMUNITY): Payer: Self-pay

## 2018-02-11 ENCOUNTER — Emergency Department (HOSPITAL_COMMUNITY)
Admission: EM | Admit: 2018-02-11 | Discharge: 2018-02-11 | Disposition: A | Discharge status: Home / Self Care | Payer: Medicaid Other | Attending: Emergency Medicine | Admitting: Emergency Medicine

## 2018-02-11 ENCOUNTER — Other Ambulatory Visit: Payer: Self-pay

## 2018-02-11 ENCOUNTER — Emergency Department (HOSPITAL_COMMUNITY): Payer: Medicaid Other

## 2018-02-11 DIAGNOSIS — N939 Abnormal uterine and vaginal bleeding, unspecified: Secondary | ICD-10-CM | POA: Diagnosis not present

## 2018-02-11 DIAGNOSIS — Z87891 Personal history of nicotine dependence: Secondary | ICD-10-CM | POA: Insufficient documentation

## 2018-02-11 DIAGNOSIS — I10 Essential (primary) hypertension: Secondary | ICD-10-CM | POA: Diagnosis not present

## 2018-02-11 DIAGNOSIS — N938 Other specified abnormal uterine and vaginal bleeding: Secondary | ICD-10-CM

## 2018-02-11 LAB — URINALYSIS, ROUTINE W REFLEX MICROSCOPIC
BACTERIA UA: NONE SEEN
Bilirubin Urine: NEGATIVE
GLUCOSE, UA: NEGATIVE mg/dL
KETONES UR: NEGATIVE mg/dL
Leukocytes, UA: NEGATIVE
NITRITE: NEGATIVE
PROTEIN: NEGATIVE mg/dL
Specific Gravity, Urine: 1.016 (ref 1.005–1.030)
pH: 5 (ref 5.0–8.0)

## 2018-02-11 LAB — I-STAT BETA HCG BLOOD, ED (MC, WL, AP ONLY): I-stat hCG, quantitative: <5 m[IU]/mL (ref <5)

## 2018-02-11 LAB — WET PREP, GENITAL
Sperm: NONE SEEN
Trich, Wet Prep: NONE SEEN
WBC, Wet Prep HPF POC: NONE SEEN
YEAST WET PREP: NONE SEEN

## 2018-02-11 LAB — CBC WITH DIFFERENTIAL/PLATELET
BASOS PCT: 0 %
Basophils Absolute: 0 10*3/uL (ref 0.0–0.1)
Eosinophils Absolute: 0.1 10*3/uL (ref 0.0–0.7)
Eosinophils Relative: 1 %
HEMATOCRIT: 39.9 % (ref 36.0–46.0)
HEMOGLOBIN: 13 g/dL (ref 12.0–15.0)
LYMPHS ABS: 2.6 10*3/uL (ref 0.7–4.0)
Lymphocytes Relative: 40 %
MCH: 28.4 pg (ref 26.0–34.0)
MCHC: 32.6 g/dL (ref 30.0–36.0)
MCV: 87.3 fL (ref 78.0–100.0)
MONOS PCT: 4 %
Monocytes Absolute: 0.3 10*3/uL (ref 0.1–1.0)
NEUTROS ABS: 3.6 10*3/uL (ref 1.7–7.7)
NEUTROS PCT: 55 %
Platelets: 186 10*3/uL (ref 150–400)
RBC: 4.57 MIL/uL (ref 3.87–5.11)
RDW: 13.2 % (ref 11.5–15.5)
WBC: 6.5 10*3/uL (ref 4.0–10.5)
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
## 2018-02-11 NOTE — Discharge Instructions (Signed)
Please follow up with [facility] to get consultation for possible biopsy to ensure no endometrial abnormalities.

## 2018-02-11 NOTE — ED Notes (Signed)
Pt states she does not want to have labs drawn

## 2018-02-12 LAB — GC/CHLAMYDIA PROBE AMP (~~LOC~~) NOT AT ARMC
Chlamydia: NEGATIVE
Neisseria Gonorrhea: NEGATIVE

## 2018-07-15 ENCOUNTER — Emergency Department (HOSPITAL_COMMUNITY): Payer: Medicaid Other

## 2018-07-15 ENCOUNTER — Emergency Department (HOSPITAL_COMMUNITY)
Admission: EM | Admit: 2018-07-15 | Discharge: 2018-07-15 | Disposition: A | Discharge status: Home / Self Care | Payer: Medicaid Other | Attending: Emergency Medicine | Admitting: Emergency Medicine

## 2018-07-15 ENCOUNTER — Encounter (HOSPITAL_COMMUNITY): Payer: Self-pay | Admitting: Emergency Medicine

## 2018-07-15 ENCOUNTER — Other Ambulatory Visit: Payer: Self-pay

## 2018-07-15 DIAGNOSIS — Z87891 Personal history of nicotine dependence: Secondary | ICD-10-CM | POA: Insufficient documentation

## 2018-07-15 DIAGNOSIS — I10 Essential (primary) hypertension: Secondary | ICD-10-CM | POA: Insufficient documentation

## 2018-07-15 DIAGNOSIS — M79671 Pain in right foot: Secondary | ICD-10-CM | POA: Insufficient documentation

## 2018-07-15 DIAGNOSIS — Y999 Unspecified external cause status: Secondary | ICD-10-CM | POA: Diagnosis not present

## 2018-07-15 DIAGNOSIS — Y9301 Activity, walking, marching and hiking: Secondary | ICD-10-CM | POA: Diagnosis not present

## 2018-07-15 MED ORDER — IBUPROFEN 800 MG PO TABS
800.0000 mg | ORAL_TABLET | Freq: Once | ORAL | Status: AC
  Administered 2018-07-15: 800 mg via ORAL
  Filled 2018-07-15: qty 1

## 2018-07-15 MED ORDER — NAPROXEN 500 MG PO TABS: 500.0000 mg | ORAL_TABLET | Freq: Two times a day (BID) | ORAL | 0 refills | Status: DC

## 2018-07-15 NOTE — ED Notes (Signed)
Ice applied to patient.

## 2018-07-15 NOTE — ED Notes (Signed)
Pt transported to xray 
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
## 2018-07-15 NOTE — Discharge Instructions (Signed)
1. Medications: naproxen 2 times a day with meals. You can supplement with tylenol as needed.  2. Treatment: Wear ankle brace as needed for support of ankle. Ice and elevate ankle throughout the day 3. Follow-up: Call the podiatrist office if symptoms are not improving

## 2018-07-15 NOTE — ED Notes (Signed)
Registration at bedside.
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
## 2018-07-15 NOTE — ED Notes (Signed)
Patient able to ambulate independently with a limp.

## 2018-08-10 ENCOUNTER — Ambulatory Visit (INDEPENDENT_AMBULATORY_CARE_PROVIDER_SITE_OTHER): Payer: Medicaid Other

## 2018-08-10 ENCOUNTER — Ambulatory Visit: Payer: Medicaid Other | Admitting: Podiatry

## 2018-08-10 DIAGNOSIS — R600 Localized edema: Secondary | ICD-10-CM

## 2018-08-10 DIAGNOSIS — S9031XA Contusion of right foot, initial encounter: Secondary | ICD-10-CM

## 2018-08-10 DIAGNOSIS — S99921A Unspecified injury of right foot, initial encounter: Secondary | ICD-10-CM

## 2018-08-10 DIAGNOSIS — M779 Enthesopathy, unspecified: Secondary | ICD-10-CM
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
## 2018-08-17 ENCOUNTER — Ambulatory Visit: Payer: Medicaid Other | Admitting: Podiatry

## 2018-08-17 ENCOUNTER — Encounter: Payer: Self-pay | Admitting: Podiatry

## 2018-08-17 DIAGNOSIS — S99921D Unspecified injury of right foot, subsequent encounter: Secondary | ICD-10-CM

## 2018-08-17 DIAGNOSIS — M779 Enthesopathy, unspecified: Secondary | ICD-10-CM

## 2018-08-17 DIAGNOSIS — S93491A Sprain of other ligament of right ankle, initial encounter: Secondary | ICD-10-CM | POA: Diagnosis not present

## 2018-08-17 NOTE — Progress Notes (Signed)
  Subjective:  Patient ID: Norma Sullivan, female    DOB: 01/15/1986,  MRN: [id number]  Chief Complaint  Patient presents with  . Foot Pain     right foot/ankle pain     32 y.o. female presents with the above complaint.  States she is having more pain in the ankle today.  The soft cast did help for a little bit.  Review of Systems: Negative except as noted in the HPI. Denies N/V/F/Ch.  Past Medical History:  Diagnosis Date  . Hypertension     Current Outpatient Medications:  .  acetaminophen (TYLENOL) 325 MG tablet, Take 650 mg by mouth every 6 (six) hours as needed., Disp: , Rfl:  .  naproxen (NAPROSYN) 500 MG tablet, Take 1 tablet (500 mg total) by mouth 2 (two) times daily with a meal., Disp: 30 tablet, Rfl: 0  Social History   Tobacco Use  Smoking Status Former Smoker  Smokeless Tobacco Never Used    No Known Allergies Objective:  There were no vitals filed for this visit. There is no height or weight on file to calculate BMI. Constitutional Well developed. Well nourished.  Vascular Dorsalis pedis pulses palpable bilaterally. Posterior tibial pulses palpable bilaterally. Capillary refill normal to all digits.  No cyanosis or clubbing noted. Pedal hair growth normal.  Neurologic Normal speech. Oriented to person, place, and time. Epicritic sensation to light touch grossly present bilaterally.  Dermatologic Nails well groomed and normal in appearance. No open wounds. No skin lesions.  Orthopedic: Normal joint ROM without pain or crepitus bilaterally. No visible deformities. Pain to palpation about the right ATFL   Assessment:   1. Sprain of anterior talofibular ligament of right ankle, initial encounter   2. Tendonitis   3. Right foot injury, subsequent encounter    Plan:  Patient was evaluated and treated and all questions answered.  Right ATFL sprain -Educated on injury -Rx for ankle stabilizing brace from [facility]. -Discussed rest ice  elevation  Return in about 6 weeks (around 09/28/2018) for Ankle sprain Right f/u.

## 2018-10-04 ENCOUNTER — Ambulatory Visit: Payer: Medicaid Other | Admitting: Podiatry

## 2018-10-04 DIAGNOSIS — M779 Enthesopathy, unspecified: Secondary | ICD-10-CM

## 2018-10-04 DIAGNOSIS — M2170 Unequal limb length (acquired), unspecified site: Secondary | ICD-10-CM

## 2018-10-04 DIAGNOSIS — M25371 Other instability, right ankle: Secondary | ICD-10-CM

## 2018-10-04 DIAGNOSIS — M25571 Pain in right ankle and joints of right foot: Secondary | ICD-10-CM

## 2018-10-04 DIAGNOSIS — M7751 Other enthesopathy of right foot: Secondary | ICD-10-CM | POA: Diagnosis not present

## 2018-10-04 DIAGNOSIS — M25471 Effusion, right ankle: Secondary | ICD-10-CM

## 2018-10-05 ENCOUNTER — Telehealth: Payer: Self-pay | Admitting: *Deleted

## 2018-10-05 DIAGNOSIS — M779 Enthesopathy, unspecified: Secondary | ICD-10-CM

## 2018-10-05 DIAGNOSIS — S93491A Sprain of other ligament of right ankle, initial encounter: Secondary | ICD-10-CM

## 2018-10-05 NOTE — Telephone Encounter (Signed)
Dr. Samuella Cota ordered refer pt to [facility] PT for Leg Length Discrepancy.

## 2018-11-01 ENCOUNTER — Ambulatory Visit: Payer: Medicaid Other | Attending: Podiatry | Admitting: Physical Therapy

## 2018-11-01 ENCOUNTER — Encounter: Payer: Self-pay | Admitting: Physical Therapy

## 2018-11-01 DIAGNOSIS — R262 Difficulty in walking, not elsewhere classified: Secondary | ICD-10-CM

## 2018-11-01 DIAGNOSIS — M25571 Pain in right ankle and joints of right foot: Secondary | ICD-10-CM | POA: Diagnosis not present
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
## 2018-11-01 NOTE — Patient Instructions (Signed)
Access Code: [email or web address]  URL: [email or web address]  Date: 11/01/2018  Prepared by: Ivery QualeBrian Nelson   Exercises  Gastroc Stretch on Wall - 3 sets - 30 hold - 2x daily - 6x weekly  Soleus Stretch on Wall - 3 sets - 30 hold - 2x daily - 6x weekly  Heel Toe Raises with Counter Support - 10 reps - 3 sets - 2x daily - 6x weekly  Seated Ankle Circles - 10 reps - 3 sets - 2x daily - 6x weekly  Single Leg Stance - 3-5 reps - 1 sets - as long as you can hold - 2x daily - 6x weekly

## 2018-11-01 NOTE — Addendum Note (Signed)
Addended by: April MansonNELSON, BRIAN R on: 11/01/2018 01:35 PM   Modules accepted: Orders

## 2018-11-13 ENCOUNTER — Ambulatory Visit: Payer: Medicaid Other | Admitting: Physical Therapy

## 2018-11-13 ENCOUNTER — Telehealth: Payer: Self-pay | Admitting: Physical Therapy

## 2018-11-13 NOTE — Telephone Encounter (Signed)
Called patient regarding her missed appt today.  I was unable to leave a message due ot her voicemail being full.   Karie MainlandJennifer Paa, PT 11/13/18 4:05 PM Phone: [phone] Fax: [phone]

## 2018-11-15 ENCOUNTER — Ambulatory Visit: Payer: Medicaid Other | Admitting: Podiatry

## 2018-11-22 ENCOUNTER — Ambulatory Visit: Payer: Medicaid Other | Admitting: Physical Therapy

## 2018-11-23 ENCOUNTER — Ambulatory Visit: Payer: Medicaid Other | Admitting: Physical Therapy

## 2018-11-23 DIAGNOSIS — R262 Difficulty in walking, not elsewhere classified: Secondary | ICD-10-CM

## 2018-11-23 DIAGNOSIS — M25571 Pain in right ankle and joints of right foot: Secondary | ICD-10-CM | POA: Diagnosis not present

## 2018-11-23 NOTE — Therapy (Signed)
[facility] [address] [place], [place], [address] Phone: [phone]   Fax:  [phone]  Physical Therapy Treatment  Patient Details  Name: Norma Sullivan MRN: [id number] Date of Birth: 03/05/1986 Referring Provider (PT): Park LiterMichael J Price DPM   Encounter Date: 11/23/2018  PT End of Session - 11/23/18 0956    Visit Number  2    Number of Visits  8    Date for PT Re-Evaluation  12/13/18    Authorization Type  MCD so resubmit after visit 3 or end of year    PT Start Time  0948   late for 0930 appt    PT Stop Time  1029    PT Time Calculation (min)  41 min    Activity Tolerance  Patient tolerated treatment well    Behavior During Therapy  [facility] for tasks assessed/performed       Past Medical History:  Diagnosis Date  . Hypertension     Past Surgical History:  Procedure Laterality Date  . CESAREAN SECTION      There were no vitals filed for this visit.  Subjective Assessment - 11/23/18 0948    Subjective  No pain.  Has been doing her exercises.  She had pain yesterday.      Currently in Pain?  No/denies    Pain Type  Chronic pain    Pain Frequency  Intermittent    Aggravating Factors   walking, cold     Pain Relieving Factors  rest          [facility] PT Assessment - 11/23/18 0001      Figure 8 Edema   Figure 8 - Right   21 inch     Figure 8 - Left   21 inch       AROM   Overall AROM Comments  Rt knee hyperext 5 deg     Right Ankle Dorsiflexion  3    Left Ankle Dorsiflexion  10            [facility] Adult PT Treatment/Exercise - 11/23/18 0001      Cryotherapy   Number Minutes Cryotherapy  8 Minutes    Cryotherapy Location  Ankle    Type of Cryotherapy  Ice pack      Ankle Exercises: Stretches   Soleus Stretch  3 reps;30 seconds    Gastroc Stretch  3 reps;30 seconds    Other Stretch  done on wall and off step as well       Ankle Exercises: Standing   SLS  on floor, on foam, up to 30 sec  several trials      Heel Raises  Both;20 reps    Heel Raises Limitations  then Rt LE only x 10    2 x 10 toes in, then toes out    Other Standing Ankle Exercises  added LLE toe tap for min dynamic       Ankle Exercises: Seated   Ankle Circles/Pumps  AROM;Strengthening;Right;20 reps    Ankle Circles/Pumps Limitations  used green band for resistance                PT Short Term Goals - 11/23/18 0949      PT SHORT TERM GOAL #1   Title  Pt will be I and compliant with HEP. 3 weeks 11/27/18    Status  Achieved        PT Long Term Goals - 11/01/18 1332  PT LONG TERM GOAL #1   Title  Pt will improve Rt Single leg stance time to >10 sec to show improved stability. 6 weeks 12/03/18    Baseline  3-5 sec    Status  New      PT LONG TERM GOAL #2   Title  Pt will report less than no more than 3/10 pain with walking, stairs, and ususal activity. 6 weeks 12/03/18    Baseline  5/10    Status  New      PT LONG TERM GOAL #3   Title  Pt will improve Rt ankle ROM to [facility]. 6 weeks 12/03/18    Baseline  limited with DF -5 deg, EV 5 deg,     Status  New            Plan - 11/23/18 0950    Clinical Impression Statement  Patient has been doing well, pain has been more intermittent, interfering with her ability to walk in the community.  At the gym, walks about a mile (vs 3 miles).  Very challenged with SLS on foam and eyes closed but did well statically on floor.        PT Treatment/Interventions  Cryotherapy;Electrical Stimulation;Iontophoresis 4mg /ml Dexamethasone;Moist Heat;Ultrasound;Gait training;Stair training;Therapeutic activities;Therapeutic exercise;Neuromuscular re-education;Dry needling;Balance training;Taping;Joint Manipulations    PT Next Visit Plan  review HEP, ankle ROM and strength, gait, progress SL activities    PT Home Exercise Plan  wall stretches, heel raises and circles , SLS add in eyes closed, soft surface     Consulted and Agree with Plan of Care  Patient        Patient will benefit from skilled therapeutic intervention in order to improve the following deficits and impairments:  Abnormal gait, Decreased activity tolerance, Decreased endurance, Decreased range of motion, Decreased strength, Difficulty walking, Hypomobility, Impaired flexibility, Pain  Visit Diagnosis: Pain in right ankle and joints of right foot  Difficulty in walking, not elsewhere classified     Problem List Patient Active Problem List   Diagnosis Date Noted  . Abnormal Pap smear of cervix 10/31/2014    , 11/23/2018, 10:24 AM  [facility] [address] [place], [place], [address] Phone: [phone]   Fax:  [phone]  Name: Norma Sullivan MRN: [id number] Date of Birth: 07/03/1986  Karie MainlandJennifer , PT 11/23/18 10:24 AM Phone: [phone] Fax: [phone]

## 2018-11-26 ENCOUNTER — Ambulatory Visit: Payer: Medicaid Other | Admitting: Physical Therapy

## 2018-11-26 DIAGNOSIS — M25571 Pain in right ankle and joints of right foot: Secondary | ICD-10-CM

## 2018-11-26 DIAGNOSIS — R262 Difficulty in walking, not elsewhere classified: Secondary | ICD-10-CM

## 2018-11-26 NOTE — Therapy (Signed)
[place], [place], [address] Phone: [phone]   Fax:  [phone]  Physical Therapy Treatment/MCD recertification  Patient Details  Name: Norma Sullivan MRN: [id number] Date of Birth: 09/17/1986 Referring Provider (PT): Evelina Bucy DPM   Encounter Date: 11/26/2018  PT End of Session - 11/26/18 1632    Visit Number  3    Number of Visits  8    Date for PT Re-Evaluation  12/13/18    Authorization Type  MCD so resubmit after visit 3 or end of year    PT Start Time  1545    PT Stop Time  1633   last 8 min on ice   PT Time Calculation (min)  48 min    Activity Tolerance  Patient tolerated treatment well    Behavior During Therapy  [facility] for tasks assessed/performed       Past Medical History:  Diagnosis Date  . Hypertension     Past Surgical History:  Procedure Laterality Date  . CESAREAN SECTION      There were no vitals filed for this visit.  Subjective Assessment - 11/26/18 1549    Subjective  Pt relays her foot is doing better, she is able to walk more and go up/down stairs with minor pain now but she still cant wear shoes with heels due to limping    Currently in Pain?  No/denies   no pain upon arrival today        [facility] PT Assessment - 11/26/18 0001      Assessment   Medical Diagnosis  Rt ankle pain, sprain, tendonitis    Referring Provider (PT)  Evelina Bucy DPM    Onset Date/Surgical Date  07/15/18    Next MD Visit  Jan 9th    Prior Therapy  none      Figure 8 Edema   Figure 8 - Right   --   no edema noted today Rt compared to Lt     AROM   Right Ankle Dorsiflexion  4    Right Ankle Plantar Flexion  --   WFL   Right Ankle Inversion  --   [facility]   Right Ankle Eversion  5      Strength   Right Ankle Dorsiflexion  5/5    Right Ankle Plantar Flexion  4+/5    Right Ankle Inversion  5/5    Right Ankle Eversion  5/5                   [facility] Adult PT  Treatment/Exercise - 11/26/18 0001      Exercises   Exercises  Ankle      Manual Therapy   Manual therapy comments  PROM for DF and ant-post mobs and distraction mobs to inc DF grade 3      Ankle Exercises: Stretches   Slant Board Stretch  3 reps;30 seconds    Slant Board Stretch Limitations  3 reps gastroc, 3 reps soleous      Ankle Exercises: Aerobic   Tread Mill  2.0 MPH, first 2 min flat, last 3 min on 5% incline      Ankle Exercises: Standing   SLS  foam 3 X 30 sec    Heel Raises  Both;20 reps    Heel Raises Limitations  foam    Toe Raise  20 reps    Toe Raise Limitations  foam    Heel Walk (Round Trip)  3   length of counter   Toe Walk (Round Trip)  3   length of counter   Other Standing Ankle Exercises  bosu balance 1 min feet together, then bosu step up and over lateral X 10 ea      Ankle Exercises: Seated   BAPS  Sitting;Level 2    BAPS Limitations  20 reps ea for ant-post, sup-pro, circles cw, ccw    Other Seated Ankle Exercises  seated ankle 4 way green Tband X 20 ea               PT Short Term Goals - 11/26/18 1615      PT SHORT TERM GOAL #1   Title  Pt will be I and compliant with HEP. 3 weeks 11/27/18    Baseline  now met    Status  Achieved        PT Long Term Goals - 11/26/18 1615      PT LONG TERM GOAL #1   Title  Pt will improve Rt Single leg stance time to >10 sec to show improved stability. 6 weeks 12/03/18    Baseline  now 30 sec    Status  Achieved      PT LONG TERM GOAL #2   Title  Pt will report less than no more than 3/10 pain with walking, stairs, and ususal activity. 6 weeks 12/03/18    Baseline  now 4/10    Status  On-going      PT LONG TERM GOAL #3   Title  Pt will improve Rt ankle ROM to [facility]. 6 weeks 12/03/18    Baseline  now 5 deg of DF    Status  Partially Met            Plan - 11/26/18 1639    Clinical Impression Statement  Pt is making good progress with PT and remains motivated to continue. There are no barriers  to progress at this time. She has improved her strength and ROM but still has deficits with PF strength and DF ROM. She will continue to benefit from skilled PT.     Rehab Potential  Good    PT Frequency  1x / week    PT Duration  4 weeks    PT Treatment/Interventions  Cryotherapy;Electrical Stimulation;Iontophoresis 19m/ml Dexamethasone;Moist Heat;Ultrasound;Gait training;Stair training;Therapeutic activities;Therapeutic exercise;Neuromuscular re-education;Dry needling;Balance training;Taping;Joint Manipulations    PT Next Visit Plan  ankle DF ROM and  ankle strength, gait, progress SL activities    PT Home Exercise Plan  wall stretches, heel raises and circles , SLS add in eyes closed, soft surface     Consulted and Agree with Plan of Care  Patient       Patient will benefit from skilled therapeutic intervention in order to improve the following deficits and impairments:  Abnormal gait, Decreased activity tolerance, Decreased endurance, Decreased range of motion, Decreased strength, Difficulty walking, Hypomobility, Impaired flexibility, Pain  Visit Diagnosis: Pain in right ankle and joints of right foot  Difficulty in walking, not elsewhere classified     Problem List Patient Active Problem List   Diagnosis Date Noted  . Abnormal Pap smear of cervix 10/31/2014    BSilvestre Mesi12/30/2019, 4:45 PM  [place] [address]Phone: [phone]  Fax:  [phone] Name: MROLANDA CAMPAMRN: [id number] of Birth: [birth year]

## 2018-12-04 ENCOUNTER — Ambulatory Visit: Payer: Medicaid Other | Attending: Podiatry | Admitting: Physical Therapy

## 2018-12-04 DIAGNOSIS — R262 Difficulty in walking, not elsewhere classified: Secondary | ICD-10-CM | POA: Insufficient documentation

## 2018-12-04 DIAGNOSIS — M25571 Pain in right ankle and joints of right foot: Secondary | ICD-10-CM | POA: Insufficient documentation

## 2018-12-06 ENCOUNTER — Ambulatory Visit: Payer: Medicaid Other | Admitting: Podiatry

## 2018-12-13 ENCOUNTER — Ambulatory Visit (INDEPENDENT_AMBULATORY_CARE_PROVIDER_SITE_OTHER): Payer: Medicaid Other | Admitting: Podiatry

## 2018-12-13 DIAGNOSIS — S93491D Sprain of other ligament of right ankle, subsequent encounter: Secondary | ICD-10-CM | POA: Diagnosis not present

## 2018-12-13 DIAGNOSIS — M25371 Other instability, right ankle: Secondary | ICD-10-CM

## 2018-12-13 DIAGNOSIS — M2420 Disorder of ligament, unspecified site: Secondary | ICD-10-CM

## 2018-12-13 NOTE — Progress Notes (Signed)
  Subjective:  Patient ID: Norma Sullivan, female    DOB: 06/12/1986,  MRN: [id number]  No chief complaint on file.  33 y.o. female presents with the above complaint.  Not wearing her ankle brace much states that she is doing PT which is helping.  Also doing exercises on her own at the gym  Review of Systems: Negative except as noted in the HPI. Denies N/V/F/Ch.  Past Medical History:  Diagnosis Date  . Hypertension     Current Outpatient Medications:  .  acetaminophen (TYLENOL) 325 MG tablet, Take 650 mg by mouth every 6 (six) hours as needed., Disp: , Rfl:  .  ibuprofen (ADVIL,MOTRIN) 600 MG tablet, Take by mouth., Disp: , Rfl:  .  naproxen (NAPROSYN) 500 MG tablet, Take 1 tablet (500 mg total) by mouth 2 (two) times daily with a meal., Disp: 30 tablet, Rfl: 0  Social History   Tobacco Use  Smoking Status Former Smoker  Smokeless Tobacco Never Used    No Known Allergies Objective:  There were no vitals filed for this visit. There is no height or weight on file to calculate BMI. Constitutional Well developed. Well nourished.  Vascular Dorsalis pedis pulses palpable bilaterally. Posterior tibial pulses palpable bilaterally. Capillary refill normal to all digits.  No cyanosis or clubbing noted. Pedal hair growth normal.  Neurologic Normal speech. Oriented to person, place, and time. Epicritic sensation to light touch grossly present bilaterally.  Dermatologic Nails well groomed and normal in appearance. No open wounds. No skin lesions.  Orthopedic: Normal joint ROM without pain or crepitus bilaterally. No visible deformities. Pain to palpation about the right ATFL   Assessment:   1. Sprain of anterior talofibular ligament of right ankle, subsequent encounter   2. Ankle instability, right   3. Ligamentous inflammation    Plan:  Patient was evaluated and treated and all questions answered.  Right ATFL sprain -Advised that she should continue to use her  ankle stabilizing brace however she states that she has not been using it much  -Advised to continue her strengthening exercises. -Continue PT to maximal medical improvement -Discussed possible ankle ligament reconstruction/stabilization should she continue to have pain however I think that she can rehab this herself without surgery.  Return if symptoms worsen or fail to improve.

## 2018-12-17 ENCOUNTER — Ambulatory Visit: Payer: Medicaid Other | Admitting: Physical Therapy

## 2018-12-17 DIAGNOSIS — R262 Difficulty in walking, not elsewhere classified: Secondary | ICD-10-CM

## 2018-12-17 DIAGNOSIS — M25571 Pain in right ankle and joints of right foot: Secondary | ICD-10-CM | POA: Diagnosis present

## 2018-12-17 NOTE — Therapy (Addendum)
[place], [place], [address] Phone: [phone]   Fax:  [phone]  Physical Therapy Treatment/Recert addendum  Patient Details  Name: Norma Sullivan MRN: [id number] Date of Birth: Jun 01, 1986 Referring Provider (PT): Evelina Bucy DPM   Encounter Date: 12/17/2018  PT End of Session - 12/17/18 1507    Visit Number  4    Number of Visits  10   Date for PT Re-Evaluation  01/30/19   Authorization Type  MCD resubmitted 12/17/18 for one per week for another 6 weeks   PT Start Time  1425   pt late   PT Stop Time  1511    PT Time Calculation (min)  46 min    Activity Tolerance  Patient tolerated treatment well    Behavior During Therapy  [facility] for tasks assessed/performed       Past Medical History:  Diagnosis Date  . Hypertension     Past Surgical History:  Procedure Laterality Date  . CESAREAN SECTION      There were no vitals filed for this visit.  Subjective Assessment - 12/17/18 1503    Subjective  Pt relays her ankle is getting stronger but it still does not feel like it is back to normal.     How long can you walk comfortably?  now 30 min    Diagnostic tests  x-rays neg    Currently in Pain?  No/denies                       [facility] Adult PT Treatment/Exercise - 12/17/18 0001      Exercises   Exercises  Ankle      Cryotherapy   Number Minutes Cryotherapy  8 Minutes    Cryotherapy Location  Ankle    Type of Cryotherapy  Ice pack      Ankle Exercises: Stretches   Soleus Stretch  2 reps;30 seconds    Soleus Stretch Limitations  slantboard    Gastroc Stretch  2 reps;30 seconds    Gastroc Stretch Limitations  slantboard      Ankle Exercises: Aerobic   Tread Mill  2.0 MPH, first 2 min flat, last 4 min on 5% incline      Ankle Exercises: Standing   SLS  foam 3 X 30 sec    Heel Raises  Both;20 reps    Heel Raises Limitations  foam    Toe Raise  20 reps    Toe  Raise Limitations  foam    Other Standing Ankle Exercises  bosu squats 2X10, then bosu step up and over lateral and fwd/back X 10 ea      Ankle Exercises: Seated   Other Seated Ankle Exercises  seated ankle 4 way green Tband X 30 ea               PT Short Term Goals - 11/26/18 1615      PT SHORT TERM GOAL #1   Title  Pt will be I and compliant with HEP. 3 weeks 11/27/18    Baseline  now met    Status  Achieved        PT Long Term Goals - 11/26/18 1615      PT LONG TERM GOAL #1   Title  Pt will improve Rt Single leg stance time to >10 sec to show improved stability. 6 weeks 12/03/18    Baseline  now 30 sec    Status  Achieved      PT LONG TERM GOAL #2   Title  Pt will report less than no more than 3/10 pain with walking, stairs, and ususal activity. 6 weeks 12/03/18    Baseline  now 4/10    Status  On-going      PT LONG TERM GOAL #3   Title  Pt will improve Rt ankle ROM to [facility]. 6 weeks 12/03/18    Baseline  now 5 deg of DF    Status  Partially Met            Plan - 12/17/18 1507    Clinical Impression Statement  Pt continues to show improvements in ankle strength and stability and balance. She was able to progress her therex today adding in more uneven surface exercises on BOSU. She also has been [place] at gym more consistenly and was instructed to add BOSU ball in at the gym since her gym has this. She does still lack ROM and overall ankle stablity and will continue to benefit from PT.     Rehab Potential  Good    PT Frequency  1x / week    PT Duration  4 weeks    PT Treatment/Interventions  Cryotherapy;Electrical Stimulation;Iontophoresis 49m/ml Dexamethasone;Moist Heat;Ultrasound;Gait training;Stair training;Therapeutic activities;Therapeutic exercise;Neuromuscular re-education;Dry needling;Balance training;Taping;Joint Manipulations    PT Next Visit Plan  ankle DF ROM and  ankle strength, gait, progress SL activities    PT Home Exercise Plan  wall stretches,  heel raises and circles , SLS add in eyes closed, soft surface     Consulted and Agree with Plan of Care  Patient       Patient will benefit from skilled therapeutic intervention in order to improve the following deficits and impairments:  Abnormal gait, Decreased activity tolerance, Decreased endurance, Decreased range of motion, Decreased strength, Difficulty walking, Hypomobility, Impaired flexibility, Pain  Visit Diagnosis: Pain in right ankle and joints of right foot  Difficulty in walking, not elsewhere classified     Problem List Patient Active Problem List   Diagnosis Date Noted  . Abnormal Pap smear of cervix 10/31/2014    BSilvestre Mesi1/20/2020, 3:10 PM  [facility]GBar[place] [address]Phone: [phone]  Fax:  [phone] Name: Norma BRANDENBURGERMRN: [id number] of Birth: [birth year]

## 2018-12-19 NOTE — Addendum Note (Signed)
Addended by: April Manson on: 12/19/2018 01:42 PM   Modules accepted: Orders

## 2018-12-23 NOTE — Progress Notes (Signed)
  Subjective:  Patient ID: Norma Sullivan, female    DOB: July 28, 1986,  MRN: [id number]  Chief Complaint  Patient presents with  . Foot Problem    6wk check on right foot injury. Pt states no more pain, brace helped. Pt states weakness/shaking in right foot/ankle, feels as though weight is being displaced to left. Pt states she now has pain in right shin/calf.    33 y.o. female presents with the above complaint.   Review of Systems: Negative except as noted in the HPI. Denies N/V/F/Ch.  Past Medical History:  Diagnosis Date  . Hypertension     Current Outpatient Medications:  .  ibuprofen (ADVIL,MOTRIN) 600 MG tablet, Take by mouth., Disp: , Rfl:  .  acetaminophen (TYLENOL) 325 MG tablet, Take 650 mg by mouth every 6 (six) hours as needed., Disp: , Rfl:  .  naproxen (NAPROSYN) 500 MG tablet, Take 1 tablet (500 mg total) by mouth 2 (two) times daily with a meal., Disp: 30 tablet, Rfl: 0  Social History   Tobacco Use  Smoking Status Former Smoker  Smokeless Tobacco Never Used    No Known Allergies Objective:  There were no vitals filed for this visit. There is no height or weight on file to calculate BMI. Constitutional Well developed. Well nourished.  Vascular Dorsalis pedis pulses palpable bilaterally. Posterior tibial pulses palpable bilaterally. Capillary refill normal to all digits.  No cyanosis or clubbing noted. Pedal hair growth normal.  Neurologic Normal speech. Oriented to person, place, and time. Epicritic sensation to light touch grossly present bilaterally.  Dermatologic Nails well groomed and normal in appearance. No open wounds. No skin lesions.  Orthopedic: Normal joint ROM without pain or crepitus bilaterally. No visible deformities. Pain to palpation about the right ATFL +Allis test   Assessment:   1. Tendonitis   2. Pain and swelling of right ankle   3. Ankle instability, right   4. Acquired unequal limb length    Plan:  Patient was  evaluated and treated and all questions answered.  Right ATFL sprain -Improving. -Continue brace as needed.  LLD -Will have patient come back when she can also be evaluated by orthotist.  Return in about 4 weeks (around 11/01/2018) for leg length discrepancy / ankle pain.

## 2019-01-03 ENCOUNTER — Ambulatory Visit: Payer: Medicaid Other | Admitting: Podiatry

## 2019-01-15 ENCOUNTER — Ambulatory Visit: Payer: Medicaid Other | Attending: Podiatry | Admitting: Physical Therapy

## 2019-01-15 ENCOUNTER — Encounter: Payer: Self-pay | Admitting: Physical Therapy

## 2019-01-15 DIAGNOSIS — R262 Difficulty in walking, not elsewhere classified: Secondary | ICD-10-CM | POA: Diagnosis present

## 2019-01-15 DIAGNOSIS — M25571 Pain in right ankle and joints of right foot: Secondary | ICD-10-CM | POA: Insufficient documentation

## 2019-01-15 NOTE — Therapy (Addendum)
[place], [place], [address] Phone: [phone]   Fax:  [phone]  Physical Therapy Treatment/Discharge Addendum  Patient Details  Name: Norma Sullivan MRN: [id number] Date of Birth: 24-Jul-1986 Referring Provider (PT): Evelina Bucy DPM   Encounter Date: 01/15/2019  PT End of Session - 01/15/19 1549    Visit Number  5    Number of Visits  10    Date for PT Re-Evaluation  01/30/19    Authorization Type  Medicaid    Authorization Time Period  12/27/18-01/23/19    Authorization - Visit Number  1    Authorization - Number of Visits  4    PT Start Time  9179    PT Stop Time  1627    PT Time Calculation (min)  42 min    Activity Tolerance  Patient tolerated treatment well    Behavior During Therapy  [facility] for tasks assessed/performed       Past Medical History:  Diagnosis Date  . Hypertension     Past Surgical History:  Procedure Laterality Date  . CESAREAN SECTION      There were no vitals filed for this visit.  Subjective Assessment - 01/15/19 1547    Subjective  No pain pre-tx. today. Primary complaint is right ankle weakness, difficulty wearing footwear such as heels.    Currently in Pain?  No/denies    Pain Score  0-No pain         [facility] PT Assessment - 01/15/19 0001      AROM   Right Ankle Dorsiflexion  3    Right Ankle Plantar Flexion  60    Right Ankle Inversion  36    Right Ankle Eversion  12      Strength   Right Ankle Plantar Flexion  4+/5                   [facility] Adult PT Treatment/Exercise - 01/15/19 0001      Exercises   Exercises  Knee/Hip;Ankle      Knee/Hip Exercises: Standing   Forward Step Up  Right;2 sets;10 reps    Forward Step Up Limitations  on Blue side BOSU    Functional Squat Limitations  TRX squat 2x10      Ankle Exercises: Aerobic   Tread Mill  1.7-2.5 mph x 5 min      Ankle Exercises: Standing   BAPS  Standing;Level 1;15 reps     BAPS Limitations  circles CW/CCW with left foot assist on board    Vector Stance  --   3 -way vector steps with right foot on Theraband pad 3x6   Rocker Board  1 minute   dynamic balance   Rebounder  --   1000 g ball feet together on Airex x 30 throws   Heel Raises  Both;20 reps    Heel Raises Limitations  with right weightshift    Other Standing Ankle Exercises  bilat. heel raise holding 10 lb. KB in ea. UE 2x10, wooden "wobble" board fw/rev x 1 min, Romberg EC on Airex 20 sec x 3    Other Standing Ankle Exercises  Pall off press on Airex-right SLS with left toe touch x 15 ea. direction with doubled green Theraband      Ankle Exercises: Stretches   Soleus Stretch  2 reps;30 seconds    Soleus Stretch Limitations  slant board    Gastroc Stretch  2 reps;30 seconds  Gastroc Stretch Limitations  slantboard             PT Education - 01/15/19 1627    Education Details  POC, balance/proprioception components, exercises    Person(s) Educated  Patient    Methods  Explanation;Demonstration    Comprehension  Verbalized understanding;Returned demonstration       PT Short Term Goals - 11/26/18 1615      PT SHORT TERM GOAL #1   Title  Pt will be I and compliant with HEP. 3 weeks 11/27/18    Baseline  now met    Status  Achieved        PT Long Term Goals - 11/26/18 1615      PT LONG TERM GOAL #1   Title  Pt will improve Rt Single leg stance time to >10 sec to show improved stability. 6 weeks 12/03/18    Baseline  now 30 sec    Status  Achieved      PT LONG TERM GOAL #2   Title  Pt will report less than no more than 3/10 pain with walking, stairs, and ususal activity. 6 weeks 12/03/18    Baseline  now 4/10    Status  On-going      PT LONG TERM GOAL #3   Title  Pt will improve Rt ankle ROM to [facility] 6 weeks 12/03/18    Baseline  now 5 deg of DF    Status  Partially Met            Plan - 01/15/19 1628    Clinical Impression Statement  Tx. focus ankle strengthening and  balance + proprioceptive challenges. Still with some ankle stiffness but primary functional limiations associated with ankle functional weakness and lack of balance/proprioception s/p injury.    PT Frequency  1x / week    PT Duration  2 weeks    PT Treatment/Interventions  Cryotherapy;Electrical Stimulation;Iontophoresis 49m/ml Dexamethasone;Moist Heat;Ultrasound;Gait training;Stair training;Therapeutic activities;Therapeutic exercise;Neuromuscular re-education;Dry needling;Balance training;Taping;Joint Manipulations    PT Next Visit Plan  ankle DF ROM and  ankle strength, gait, progress SL activities    PT Home Exercise Plan  wall stretches, heel raises and circles , SLS add in eyes closed, soft surface     Consulted and Agree with Plan of Care  Patient       Patient will benefit from skilled therapeutic intervention in order to improve the following deficits and impairments:  Abnormal gait, Decreased activity tolerance, Decreased endurance, Decreased range of motion, Decreased strength, Difficulty walking, Hypomobility, Impaired flexibility, Pain  Visit Diagnosis: Pain in right ankle and joints of right foot  Difficulty in walking, not elsewhere classified     Problem List Patient Active Problem List   Diagnosis Date Noted  . Abnormal Pap smear of cervix 10/31/2014    CBeaulah Dinning PT, DPT 01/15/19 4:30 PM PHYSICAL THERAPY DISCHARGE SUMMARY  Visits from Start of Care: 5  Current functional level related to goals / functional outcomes: See above   Remaining deficits: See above   Education / Equipment: HEP Plan: Patient agrees to discharge.  Patient goals were partially met. Patient is being discharged due to not returning since the last visit.  ?????    BElsie Ra PT, DPT 02/11/19 9:17 AM  [facility] [address]Phone: [phone]  Fax:  [phone] Name: Norma OPPERMANMRN: [id number] of Birth: [birth year]

## 2019-11-03 ENCOUNTER — Encounter (HOSPITAL_COMMUNITY): Payer: Self-pay

## 2019-11-03 ENCOUNTER — Other Ambulatory Visit: Payer: Self-pay

## 2019-11-03 ENCOUNTER — Ambulatory Visit (INDEPENDENT_AMBULATORY_CARE_PROVIDER_SITE_OTHER): Payer: Worker's Compensation

## 2019-11-03 ENCOUNTER — Ambulatory Visit (HOSPITAL_COMMUNITY)
Admission: EM | Admit: 2019-11-03 | Discharge: 2019-11-03 | Disposition: A | Discharge status: Home / Self Care | Payer: Worker's Compensation | Attending: Family Medicine | Admitting: Family Medicine

## 2019-11-03 ENCOUNTER — Ambulatory Visit (HOSPITAL_COMMUNITY): Payer: Worker's Compensation

## 2019-11-03 ENCOUNTER — Ambulatory Visit (HOSPITAL_COMMUNITY): Payer: PRIVATE HEALTH INSURANCE

## 2019-11-03 DIAGNOSIS — M62830 Muscle spasm of back: Secondary | ICD-10-CM

## 2019-11-03 MED ORDER — IBUPROFEN 800 MG PO TABS: 800.0000 mg | ORAL_TABLET | Freq: Three times a day (TID) | ORAL | 0 refills | Status: DC

## 2019-11-03 MED ORDER — HYDROCODONE-ACETAMINOPHEN 7.5-325 MG PO TABS: 1.0000 | ORAL_TABLET | Freq: Four times a day (QID) | ORAL | 0 refills | Status: DC | PRN

## 2019-11-03 MED ORDER — TIZANIDINE HCL 4 MG PO TABS: 4.0000 mg | ORAL_TABLET | Freq: Four times a day (QID) | ORAL | 0 refills | Status: DC | PRN

## 2019-11-03 NOTE — ED Provider Notes (Signed)
[facility]    CSN: [id number] Arrival date & time: 11/03/19  1015      History   Chief Complaint Chief Complaint  Patient presents with  . Back Pain    HPI Norma Sullivan is a 33 y.o. female.   HPI   Patient states that she hurt her back while at work.  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  She states that she bent over at the waist and placed her hands under the arms of a patient who was sitting on the floor.  She then, with a coworker, attempted to hoist her from the floor up onto her bed.  She felt immediate pull in the right side of her low back.  This is gotten progressively worse since that time.  She acknowledges that she did not use ideal body mechanics.  She was hardly able to get out of bed this morning.  Walks in a slightly flexed position.  Pain with movement.  No numbness or weakness into the legs.  No history of any back problems in the past.   History reviewed. No pertinent past medical history.  Patient Active Problem List   Diagnosis Date Noted  . Abnormal Pap smear of cervix 10/31/2014    Past Surgical History:  Procedure Laterality Date  . CESAREAN SECTION      OB History   No obstetric history on file.      Home Medications    Prior to Admission medications   Medication Sig Start Date End Date Taking? Authorizing Provider  acetaminophen (TYLENOL) 325 MG tablet Take 650 mg by mouth every 6 (six) hours as needed.    [provider]  HYDROcodone-acetaminophen (NORCO) 7.5-325 MG tablet Take 1 tablet by mouth every 6 (six) hours as needed for moderate pain. 11/03/19   Eustace MooreNelson, Santiago Graf Sue, MD  ibuprofen (ADVIL) 800 MG tablet Take 1 tablet (800 mg total) by mouth 3 (three) times daily. 11/03/19   Eustace MooreNelson, Geniyah Eischeid Sue, MD  tiZANidine (ZANAFLEX) 4 MG tablet Take 1-2 tablets (4-8 mg total) by mouth every 6 (six) hours as needed for muscle spasms. 11/03/19   Eustace MooreNelson, Darol Cush Sue, MD    Family History Family History  Problem  Relation Age of Onset  . COPD Mother     Social History Social History   Tobacco Use  . Smoking status: Former Games developermoker  . Smokeless tobacco: Never Used  Substance Use Topics  . Alcohol use: Yes  . Drug use: No     Allergies   Patient has no known allergies.   Review of Systems Review of Systems  Constitutional: Negative for chills and fever.  HENT: Negative for ear pain and sore throat.   Eyes: Negative for pain and visual disturbance.  Respiratory: Negative for cough and shortness of breath.   Cardiovascular: Negative for chest pain and palpitations.  Gastrointestinal: Negative for abdominal pain and vomiting.  Genitourinary: Negative for dysuria and hematuria.  Musculoskeletal: Positive for back pain. Negative for arthralgias.  Skin: Negative for color change and rash.  Neurological: Negative for seizures and syncope.  All other systems reviewed and are negative.    Physical Exam Triage Vital Signs ED Triage Vitals  Enc Vitals Group     BP 11/03/19 1115 118/72     Pulse Rate 11/03/19 1115 96     Resp 11/03/19 1115 18     Temp 11/03/19 1115 99 F (37.2 C)     Temp Source 11/03/19 1115 Oral  SpO2 11/03/19 1115 100 %     Weight 11/03/19 1112 200 lb (90.7 kg)     Height --      Head Circumference --      Peak Flow --      Pain Score 11/03/19 1111 8     Pain Loc --      Pain Edu? --      Excl. in [place]? --    No data found.  Updated Vital Signs BP 118/72 (BP Location: Right Arm)   Pulse 96   Temp 99 F (37.2 C) (Oral)   Resp 18   Wt 90.7 kg   LMP 10/31/2019   SpO2 100%   BMI 33.28 kg/m      Physical Exam Constitutional:      General: She is not in acute distress.    Appearance: She is well-developed. She is obese.     Comments: Uncomfortable  HENT:     Head: Normocephalic and atraumatic.     Mouth/Throat:     Comments: Mask in place Eyes:     Conjunctiva/sclera: Conjunctivae normal.     Pupils: Pupils are equal, round, and reactive to  light.  Neck:     Musculoskeletal: Normal range of motion.  Cardiovascular:     Rate and Rhythm: Normal rate.  Pulmonary:     Effort: Pulmonary effort is normal. No respiratory distress.  Abdominal:     General: There is no distension.     Palpations: Abdomen is soft.  Musculoskeletal: Normal range of motion.     Comments: Palpable tenderness in the right lumbar column of muscles.  Muscles are tight and firm to palpation.  Tenderness extends down to the right SI joint region.  Strength, sensation, range of motion, reflexes normal in both lower extremities  Skin:    General: Skin is warm and dry.  Neurological:     General: No focal deficit present.     Mental Status: She is alert.     Gait: Gait abnormal.     Deep Tendon Reflexes: Reflexes normal.  Psychiatric:        Mood and Affect: Mood normal.        Behavior: Behavior normal.      UC Treatments / Results  Labs (all labs ordered are listed, but only abnormal results are displayed) Labs Reviewed - No data to display  EKG   Radiology Dg Lumbar Spine Complete  Result Date: 11/03/2019 CLINICAL DATA:  Back pain. Lifting injury. EXAM: LUMBAR SPINE - COMPLETE 4+ VIEW COMPARISON:  02/18/2017 FINDINGS: Normal alignment of the lumbar vertebral bodies. Disc spaces and vertebral bodies are maintained. The facets are normally aligned. No pars defects. The visualized bony pelvis is intact. IMPRESSION: Normal alignment and no acute bony findings. Electronically Signed   By: Marijo Sanes M.D.   On: 11/03/2019 12:48    Procedures Procedures (including critical care time)  Medications Ordered in UC Medications - No data to display  Initial Impression / Assessment and Plan / UC Course  I have reviewed the triage vital signs and the nursing notes.  Pertinent labs & imaging results that were available during my care of the patient were reviewed by me and considered in my medical decision making (see chart for details).      Patient was initially presented with my treatment plan and recommendations that did not include x-rays.  She expressed concern at this decision.  She states that her pain is "severe" and that she feels  x-rays are indicated.  They were performed, and I did show her her normal x-rays Final Clinical Impressions(s) / UC Diagnoses   Final diagnoses:  Muscle spasm of back     Discharge Instructions     Take ibuprofen 3 times a day with food This is an anti-inflammatory pain medication Take muscle relaxer at bedtime.  May take during the day as needed.  This may cause drowsiness. Take pain medication if needed in addition to the ibuprofen.  Do not drive or operate machinery on the pain medication. Call your human resources department tomorrow to see about Worker's Comp. follow-up.  You need to be seen next week   ED Prescriptions    Medication Sig Dispense Auth. Provider   tiZANidine (ZANAFLEX) 4 MG tablet Take 1-2 tablets (4-8 mg total) by mouth every 6 (six) hours as needed for muscle spasms. 21 tablet Eustace Moore, MD   ibuprofen (ADVIL) 800 MG tablet Take 1 tablet (800 mg total) by mouth 3 (three) times daily. 21 tablet Eustace Moore, MD   HYDROcodone-acetaminophen [facility]) 7.5-325 MG tablet Take 1 tablet by mouth every 6 (six) hours as needed for moderate pain. 15 tablet Eustace Moore, MD     I have reviewed the PDMP during this encounter.   Eustace Moore, MD 11/03/19 [phone]

## 2019-11-03 NOTE — Discharge Instructions (Signed)
Take ibuprofen 3 times a day with food This is an anti-inflammatory pain medication Take muscle relaxer at bedtime.  May take during the day as needed.  This may cause drowsiness. Take pain medication if needed in addition to the ibuprofen.  Do not drive or operate machinery on the pain medication. Call your human resources department tomorrow to see about Worker's Comp. follow-up.  You need to be seen next week

## 2019-11-03 NOTE — ED Triage Notes (Signed)
Pt states she has back pain from helping a  Pt  get in bed. Pt states this happened at work yesterday.

## 2020-03-19 ENCOUNTER — Ambulatory Visit (HOSPITAL_COMMUNITY)
Admission: EM | Admit: 2020-03-19 | Discharge: 2020-03-19 | Disposition: A | Discharge status: Home / Self Care | Payer: Medicaid Other

## 2020-03-19 ENCOUNTER — Other Ambulatory Visit: Payer: Self-pay

## 2020-03-19 ENCOUNTER — Encounter (HOSPITAL_COMMUNITY): Payer: Self-pay

## 2020-03-19 DIAGNOSIS — Z4889 Encounter for other specified surgical aftercare: Secondary | ICD-10-CM

## 2020-03-19 DIAGNOSIS — Z4802 Encounter for removal of sutures: Secondary | ICD-10-CM

## 2020-03-19 NOTE — Discharge Instructions (Addendum)
Your incisions look great, and your sutures are ready to come out.  We will remove sutures today.  We will place a Steri-Strip to the one suture down in the middle of your back so that it will be protected from your bra strap.  I would continue the Bactrim as prescribed by your surgeon.  Follow-up with your surgeon or with primary care if any issues are persisting.  Follow-up with the ER for trouble swallowing, trouble breathing, redness streaking from any of your incisions, severe pain, other concerning symptoms.

## 2020-03-19 NOTE — ED Provider Notes (Signed)
[facility]    CSN: [id number] Arrival date & time: 03/19/20  1105      History   Chief Complaint Chief Complaint  Patient presents with  . Follow-up    HPI Norma Sullivan is a 34 y.o. female.   Patient reports that she had a [place] butt lift as well as liposuction 3 weeks ago in [place].  Reports that she was having issues getting back to [facility] for her postop visit.  She is requesting that we evaluate her incisions and remove sutures if they are ready.  Patient reports that she is taking Bactrim as prescribed by her surgeon.  Patient states that she has 1 more week of that to go.  Patient denies any new pain, excessive tenderness to any of her incision sites.  Denies purulent drainage.  Denies headache, cough, shortness of breath, nausea, vomiting, diarrhea, rash, fever, other symptoms.  ROS per HPI  The history is provided by the patient.    History reviewed. No pertinent past medical history.  Patient Active Problem List   Diagnosis Date Noted  . Abnormal Pap smear of cervix 10/31/2014    Past Surgical History:  Procedure Laterality Date  . CESAREAN SECTION    . COSMETIC SURGERY      OB History   No obstetric history on file.      Home Medications    Prior to Admission medications   Medication Sig Start Date End Date Taking? Authorizing Provider  acetaminophen (TYLENOL) 325 MG tablet Take 650 mg by mouth every 6 (six) hours as needed.    [provider]  HYDROcodone-acetaminophen (NORCO) 7.5-325 MG tablet Take 1 tablet by mouth every 6 (six) hours as needed for moderate pain. 11/03/19   Eustace Moore, MD  ibuprofen (ADVIL) 800 MG tablet Take 1 tablet (800 mg total) by mouth 3 (three) times daily. 11/03/19   Eustace Moore, MD  tiZANidine (ZANAFLEX) 4 MG tablet Take 1-2 tablets (4-8 mg total) by mouth every 6 (six) hours as needed for muscle spasms. 11/03/19   Eustace Moore, MD    Family History Family History    Problem Relation Age of Onset  . COPD Mother     Social History Social History   Tobacco Use  . Smoking status: Former Games developer  . Smokeless tobacco: Never Used  Substance Use Topics  . Alcohol use: Yes  . Drug use: No     Allergies   Patient has no known allergies.   Review of Systems Review of Systems   Physical Exam Triage Vital Signs ED Triage Vitals  Enc Vitals Group     BP 03/19/20 1148 119/80     Pulse Rate 03/19/20 1148 82     Resp 03/19/20 1148 18     Temp 03/19/20 1148 98.1 F (36.7 C)     Temp Source 03/19/20 1148 Oral     SpO2 03/19/20 1148 98 %     Weight --      Height --      Head Circumference --      Peak Flow --      Pain Score 03/19/20 1143 4     Pain Loc --      Pain Edu? --      Excl. in GC? --    No data found.  Updated Vital Signs BP 119/80 (BP Location: Right Arm)   Pulse 82   Temp 98.1 F (36.7 C) (Oral)   Resp 18  LMP 03/09/2020   SpO2 98%   Visual Acuity Right Eye Distance:   Left Eye Distance:   Bilateral Distance:    Right Eye Near:   Left Eye Near:    Bilateral Near:     Physical Exam Vitals and nursing note reviewed.  Constitutional:      General: She is not in acute distress.    Appearance: Normal appearance. She is well-developed and normal weight.  HENT:     Head: Normocephalic and atraumatic.     Nose: Nose normal.     Mouth/Throat:     Mouth: Mucous membranes are moist.     Pharynx: Oropharynx is clear.  Eyes:     Extraocular Movements: Extraocular movements intact.     Conjunctiva/sclera: Conjunctivae normal.     Pupils: Pupils are equal, round, and reactive to light.  Cardiovascular:     Rate and Rhythm: Normal rate and regular rhythm.     Heart sounds: Normal heart sounds. No murmur.  Pulmonary:     Effort: Pulmonary effort is normal. No respiratory distress.     Breath sounds: Normal breath sounds. No stridor. No wheezing, rhonchi or rales.  Chest:     Chest wall: No tenderness.  Abdominal:      General: Bowel sounds are normal. There is no distension.     Palpations: Abdomen is soft. There is no mass.     Tenderness: There is no abdominal tenderness. There is no right CVA tenderness, left CVA tenderness, guarding or rebound.     Hernia: No hernia is present.  Musculoskeletal:        General: Normal range of motion.     Cervical back: Normal range of motion and neck supple.  Skin:    General: Skin is warm and dry.     Capillary Refill: Capillary refill takes less than 2 seconds.     Findings: Bruising present.          Comments: There is bruising present around the incision sites on patient's buttocks at the suture site numbers 8 through 12.  At suture site #7, we will remove and place Steri-Strips over that there will not be irritated by bra strap.  No sign of infection, no warmth, tenderness, heat to any of the incision sites.  Neurological:     General: No focal deficit present.     Mental Status: She is alert and oriented to person, place, and time.  Psychiatric:        Mood and Affect: Mood normal.        Behavior: Behavior normal.        Thought Content: Thought content normal.      UC Treatments / Results  Labs (all labs ordered are listed, but only abnormal results are displayed) Labs Reviewed - No data to display  EKG   Radiology No results found.  Procedures Procedures (including critical care time)  Medications Ordered in UC Medications - No data to display  Initial Impression / Assessment and Plan / UC Course  I have reviewed the triage vital signs and the nursing notes.  Pertinent labs & imaging results that were available during my care of the patient were reviewed by me and considered in my medical decision making (see chart for details).     Surgical wound check: Patient presents for postsurgical wound check and possible suture removal today.  Patient had resolving but left with liposuction 3 weeks ago.  Was supposed to have follow-up  with her  surgeon today, but she could not get back to [facility] today which is where she originally had her surgery.  Patient presents with 12 sutures in incision sites.  These are noted on diagram above.  There is no tenderness, erythema, heat noted to any of the suture sites.  Patient is currently taking Bactrim for 1 more week.  We will place Steri-Strips over suture site #7 as it appears to have had a scab that has fallen off or been rubbed off.  Had a new removed sutures today.  Patient instructed to follow her surgeon's instructions which include continue to wear her compression suit, lymphatic massage, following up with any areas of redness, tenderness, heat, drainage of any color from any other incision sites.  Patient instructed to follow-up with her surgeon as soon as she can.  Patient instructed to go to the ER for trouble swallowing, trouble breathing, other concerning symptoms.  Patient verbalized understanding is in agreement treatment plan. Final Clinical Impressions(s) / UC Diagnoses   Final diagnoses:  Visit for suture removal  Encounter for post surgical wound check     Discharge Instructions     Your incisions look great, and your sutures are ready to come out.  We will remove sutures today.  We will place a Steri-Strip to the one suture down in the middle of your back so that it will be protected from your bra strap.  I would continue the Bactrim as prescribed by your surgeon.  Follow-up with your surgeon or with primary care if any issues are persisting.  Follow-up with the ER for trouble swallowing, trouble breathing, redness streaking from any of your incisions, severe pain, other concerning symptoms.    ED Prescriptions    None     PDMP not reviewed this encounter.   Faustino Congress, NP 03/20/20 1249

## 2020-03-19 NOTE — ED Notes (Signed)
Pt had 14 sutures removed in total from 1 LLQ abdomen, 1 RLQ abdomen, 1 Navel, 1 ULQ back, 1 URQ back, 2 middle of back, 1 middle of buttocks, 1 UL buttocks, 1 LL buttocks, 1 UR buttocks, and 1 LR buttocks

## 2020-03-19 NOTE — ED Triage Notes (Signed)
Pt presents for suture removal after getting cosmetic surgery.

## 2021-08-30 ENCOUNTER — Other Ambulatory Visit: Payer: Self-pay

## 2021-08-30 ENCOUNTER — Encounter (HOSPITAL_COMMUNITY): Payer: Self-pay

## 2021-08-30 ENCOUNTER — Emergency Department (HOSPITAL_COMMUNITY)
Admission: EM | Admit: 2021-08-30 | Discharge: 2021-08-31 | Disposition: A | Discharge status: Home / Self Care | Payer: Medicaid Other | Attending: Emergency Medicine | Admitting: Emergency Medicine

## 2021-08-30 ENCOUNTER — Emergency Department (HOSPITAL_COMMUNITY): Payer: Medicaid Other

## 2021-08-30 DIAGNOSIS — O039 Complete or unspecified spontaneous abortion without complication: Secondary | ICD-10-CM

## 2021-08-30 DIAGNOSIS — Z87891 Personal history of nicotine dependence: Secondary | ICD-10-CM | POA: Diagnosis not present

## 2021-08-30 DIAGNOSIS — N939 Abnormal uterine and vaginal bleeding, unspecified: Secondary | ICD-10-CM

## 2021-08-30 DIAGNOSIS — R000 Tachycardia, unspecified: Secondary | ICD-10-CM | POA: Diagnosis not present

## 2021-08-30 DIAGNOSIS — R102 Pelvic and perineal pain: Secondary | ICD-10-CM

## 2021-08-30 LAB — COMPREHENSIVE METABOLIC PANEL
ALT: 27 U/L (ref 0–44)
AST: 24 U/L (ref 15–41)
Albumin: 4.5 g/dL (ref 3.5–5.0)
Alkaline Phosphatase: 65 U/L (ref 38–126)
Anion gap: 9 (ref 5–15)
BUN: 12 mg/dL (ref 6–20)
CO2: 25 mmol/L (ref 22–32)
Calcium: 9.4 mg/dL (ref 8.9–10.3)
Chloride: 103 mmol/L (ref 98–111)
Creatinine, Ser: 0.84 mg/dL (ref 0.44–1.00)
GFR, Estimated: >60 mL/min (ref >60)
Glucose, Bld: 100 mg/dL — ABNORMAL HIGH (ref 70–99)
Potassium: 3.8 mmol/L (ref 3.5–5.1)
Sodium: 137 mmol/L (ref 135–145)
Total Bilirubin: 0.8 mg/dL (ref 0.3–1.2)
Total Protein: 7.8 g/dL (ref 6.5–8.1)

## 2021-08-30 LAB — I-STAT BETA HCG BLOOD, ED (MC, WL, AP ONLY): I-stat hCG, quantitative: 57.2 m[IU]/mL — ABNORMAL HIGH (ref <5)

## 2021-08-30 LAB — HCG, QUANTITATIVE, PREGNANCY: hCG, Beta Chain, Quant, S: 67 m[IU]/mL — ABNORMAL HIGH (ref <5)

## 2021-08-30 MED ORDER — SODIUM CHLORIDE 0.9 % IV BOLUS
1000.0000 mL | Freq: Once | INTRAVENOUS | Status: AC
  Administered 2021-08-30: 1000 mL via INTRAVENOUS

## 2021-08-30 MED ORDER — FENTANYL CITRATE PF 50 MCG/ML IJ SOSY
50.0000 ug | PREFILLED_SYRINGE | Freq: Once | INTRAMUSCULAR | Status: AC
  Administered 2021-08-30: 50 ug via INTRAVENOUS
  Filled 2021-08-30: qty 1

## 2021-08-30 MED ORDER — ONDANSETRON HCL 4 MG/2ML IJ SOLN
4.0000 mg | Freq: Once | INTRAMUSCULAR | Status: AC
  Administered 2021-08-30: 4 mg via INTRAVENOUS
  Filled 2021-08-30: qty 2

## 2021-08-30 NOTE — ED Provider Notes (Signed)
[facility]EMERGENCY [facility]PT Provider Note   CSN: [id number] Arrival date & time: 08/30/21  2222     History Chief Complaint  Patient presents with   Bleeding During Pregnancy    Norma Sullivan is a 35 y.o. female.  Patient is a 35 year old G15P1001 female complaining of pain in her suprapubic abdomen and low back.  She reports that pain began this morning before work and acutely worsened around 2000 once getting off work.  She tried Tylenol for symptoms without relief.  She has been experiencing vaginal bleeding throughout the day.  Had a positive home pregnancy test 1 month ago.  Not currently followed by an OB/GYN.  Received previous care at the [facility]  LMP 07/07/21.  Abdominal surgical history significant for C-section.  The history is provided by the patient. No language interpreter was used.      History reviewed. No pertinent past medical history.  Patient Active Problem List   Diagnosis Date Noted   Abnormal Pap smear of cervix 10/31/2014    Past Surgical History:  Procedure Laterality Date   CESAREAN SECTION     COSMETIC SURGERY       OB History   No obstetric history on file.     Family History  Problem Relation Age of Onset   COPD Mother     Social History   Tobacco Use   Smoking status: Former   Smokeless tobacco: Never  Substance Use Topics   Alcohol use: Yes   Drug use: No    Home Medications Prior to Admission medications   Medication Sig Start Date End Date Taking? Authorizing Provider  acetaminophen (TYLENOL) 325 MG tablet Take 650 mg by mouth every 6 (six) hours as needed for mild pain.   Yes [provider]  HYDROcodone-acetaminophen (NORCO/VICODIN) 5-325 MG tablet Take 1-2 tablets by mouth every 6 (six) hours as needed for severe pain. 08/31/21  Yes Antony Madura, PA-C  ibuprofen (ADVIL) 800 MG tablet Take 1 tablet (800 mg total) by mouth 3 (three) times daily. Patient not taking:  Reported on 08/31/2021 11/03/19   Eustace Moore, MD  tiZANidine (ZANAFLEX) 4 MG tablet Take 1-2 tablets (4-8 mg total) by mouth every 6 (six) hours as needed for muscle spasms. Patient not taking: Reported on 08/31/2021 11/03/19   Eustace Moore, MD    Allergies    Patient has no known allergies.  Review of Systems   Review of Systems Ten systems reviewed and are negative for acute change, except as noted in the HPI.    Physical Exam Updated Vital Signs BP (!) 134/118   Pulse 91   Temp 98.3 F (36.8 C)   Resp 19   Ht 5\' 5"  (1.651 m)   Wt 90.7 kg   SpO2 100%   BMI 33.28 kg/m   Physical Exam Vitals and nursing note reviewed. Exam conducted with a chaperone present.  Constitutional:      Appearance: She is well-developed. She is not diaphoretic.     Comments: Alert, writhing around in the bed. Moaning, uncomfortable.  HENT:     Head: Normocephalic and atraumatic.  Eyes:     General: No scleral icterus.    Conjunctiva/sclera: Conjunctivae normal.  Cardiovascular:     Rate and Rhythm: Regular rhythm. Tachycardia present.     Pulses: Normal pulses.  Pulmonary:     Effort: Pulmonary effort is normal. No respiratory distress.     Comments: Respirations even and unlabored  Abdominal:     Comments: Abdomen soft, obese. TTP in the suprapubic abdomen.  Genitourinary:    Comments: Deferred   0100 - Patient with scant blood at cervical os w/o active bleeding. No signs of vaginal trauma. No discharge, purulence. Musculoskeletal:        General: Normal range of motion.     Cervical back: Normal range of motion.  Skin:    General: Skin is warm and dry.     Coloration: Skin is not pale.     Findings: No erythema or rash.  Neurological:     Mental Status: She is alert and oriented to person, place, and time.     Coordination: Coordination normal.  Psychiatric:        Behavior: Behavior normal.    ED Results / Procedures / Treatments   Labs (all labs ordered are  listed, but only abnormal results are displayed) Labs Reviewed  WET PREP, GENITAL - Abnormal; Notable for the following components:      Result Value   Clue Cells Wet Prep HPF POC PRESENT (*)    All other components within normal limits  COMPREHENSIVE METABOLIC PANEL - Abnormal; Notable for the following components:   Glucose, Bld 100 (*)    All other components within normal limits  HCG, QUANTITATIVE, PREGNANCY - Abnormal; Notable for the following components:   hCG, Beta Chain, Quant, S 67 (*)    All other components within normal limits  URINALYSIS, ROUTINE W REFLEX MICROSCOPIC - Abnormal; Notable for the following components:   Color, Urine STRAW (*)    Hgb urine dipstick MODERATE (*)    Bacteria, UA RARE (*)    All other components within normal limits  I-STAT BETA HCG BLOOD, ED ([facility], [facility], [facility] ONLY) - Abnormal; Notable for the following components:   I-stat hCG, quantitative 57.2 (*)    All other components within normal limits  CBC WITH DIFFERENTIAL/PLATELET  ABO/RH  TYPE AND SCREEN  GC/CHLAMYDIA PROBE AMP ([place]) NOT AT [facility]    EKG None  Radiology US OB Comp < 14 Wks  Result Date: 08/31/2021 CLINICAL DATA:  Pelvic pain and vaginal bleeding for 1 day with low positive beta HCG level EXAM: OBSTETRIC <14 WK [place] AND TRANSVAGINAL OB US TECHNIQUE: Both transabdominal and transvaginal ultrasound examinations were performed for complete evaluation of the gestation as well as the maternal uterus, adnexal regions, and pelvic cul-de-sac. Transvaginal technique was performed to assess early pregnancy. COMPARISON:  None. FINDINGS: Intrauterine gestational sac: Fluid is noted within the endometrial canal. No definitive gestational sac is noted. Maternal uterus/adnexae: Uterus is well visualized. Changes of free fluid within the endometrial canal are seen. No discrete mass lesion is noted. Patient was unable to tolerate significant transvaginal scanning. IMPRESSION: No evidence of  intrauterine gestation. Mild fluid is noted within the endometrial canal. Follow-up of beta HCG levels is recommended. Repeat ultrasound could be performed as clinically indicated. Electronically Signed   By: Alcide Clever M.D.   On: 08/31/2021 00:27   US OB Transvaginal  Result Date: 08/31/2021 CLINICAL DATA:  Pelvic pain and vaginal bleeding for 1 day with low positive beta HCG level EXAM: OBSTETRIC <14 WK [place] AND TRANSVAGINAL OB US TECHNIQUE: Both transabdominal and transvaginal ultrasound examinations were performed for complete evaluation of the gestation as well as the maternal uterus, adnexal regions, and pelvic cul-de-sac. Transvaginal technique was performed to assess early pregnancy. COMPARISON:  None. FINDINGS: Intrauterine gestational sac: Fluid is noted within the endometrial canal. No  definitive gestational sac is noted. Maternal uterus/adnexae: Uterus is well visualized. Changes of free fluid within the endometrial canal are seen. No discrete mass lesion is noted. Patient was unable to tolerate significant transvaginal scanning. IMPRESSION: No evidence of intrauterine gestation. Mild fluid is noted within the endometrial canal. Follow-up of beta HCG levels is recommended. Repeat ultrasound could be performed as clinically indicated. Electronically Signed   By: Alcide Clever M.D.   On: 08/31/2021 00:27    Procedures Procedures   Medications Ordered in ED Medications  fentaNYL (SUBLIMAZE) injection 50 mcg (50 mcg Intravenous Given 08/30/21 2321)  ondansetron (ZOFRAN) injection 4 mg (4 mg Intravenous Given 08/30/21 2321)  sodium chloride 0.9 % bolus 1,000 mL (0 mLs Intravenous Stopped 08/31/21 0450)  fentaNYL (SUBLIMAZE) injection 50 mcg (50 mcg Intravenous Given 08/31/21 0054)  ondansetron (ZOFRAN) injection 4 mg (4 mg Intravenous Given 08/31/21 0203)  ketorolac (TORADOL) 15 MG/ML injection 15 mg (15 mg Intravenous Given 08/31/21 0203)  oxyCODONE-acetaminophen (PERCOCET/ROXICET) 5-325 MG per  tablet 2 tablet (2 tablets Oral Given 08/31/21 0130)    ED Course  I have reviewed the triage vital signs and the nursing notes.  Pertinent labs & imaging results that were available during my care of the patient were reviewed by me and considered in my medical decision making (see chart for details).  Clinical Course as of 08/31/21 0453  Tue Aug 31, 2021  0130 Spoke with [place], APP at [facility], regarding patient's symptoms.  Discussed elevated hCG of 67 as well as findings of mild fluid within the endometrial canal.  There is no evidence of intrauterine gestation or retained products.  Rayfield Citizen made aware of persistent pain which seems out of proportion to exam and imaging findings.  Based on aforementioned details, Rayfield Citizen does not feel that transfer to [facility] for further OB intervention is presently indicated for the patient.  She does agree with 48-hour outpatient follow-up for trending of the patient's hCG level.  Recommends continued pain control and patient reassurance.  Will reach back out if any clinical changes. [KH]  0300 Patient reports that her pain has improved.  No longer nauseated.  Her bleeding has resumed and she reports it to be heavy; however, she is hemodynamically stable and hemoglobin on arrival was reassuring.  Did discuss bleeding precautions necessitating return for evaluation.  Patient advised to seek follow-up at [facility] should complications arise prior to scheduled outpatient visit. [KH]    Clinical Course User Index [KH] Antony Madura, PA-C   MDM Rules/Calculators/A&P                           35 year old female presents to the emergency department for suprapubic abdominal pain with associated low back pain.  Her symptoms have been associated with vaginal bleeding.  Reports LMP of 07/07/2021 with positive home pregnancy test.  Her hCG today is elevated to 67.  Noted to have waxing and waning suprapubic abdominal pain with associated tenderness to palpation.  Ultrasound was  ordered to assess for ectopic pregnancy.  There is no evidence of intrauterine gestation or ectopic.  No large amount of pelvic free fluid to suggest rupture, hemorrhage.  Labs have been otherwise reassuring with stable H/H.  VSS with improvement in tachycardia with IVF and pain control.  Clinical work-up today is consistent with spontaneous abortion.  Case has been discussed with [facility] APP who has scheduled patient for repeat hCG testing in 48 hours.  The patient has been instructed  to go to [facility] should symptoms worsen prior to this follow-up evaluation.  Patient discharged in stable condition with no unaddressed concerns.   Final Clinical Impression(s) / ED Diagnoses Final diagnoses:  SAB (spontaneous abortion)    Rx / DC Orders ED Discharge Orders          Ordered    HYDROcodone-acetaminophen (NORCO/VICODIN) 5-325 MG tablet  Every 6 hours PRN        08/31/21 0307             Antony Madura, PA-C 08/31/21 0454    Tilden Fossa, MD 09/01/21 [phone]

## 2021-08-30 NOTE — ED Triage Notes (Signed)
Pt states she missed her period in September, and has been heavily bleeding all day. Pt c/o severe cramping. Pt states she took a home pregnancy test and it was positive when she missed her period last month.

## 2021-08-30 NOTE — ED Provider Notes (Signed)
Emergency Medicine Provider Triage Evaluation Note  Norma Sullivan , a 35 y.o. female  was evaluated in triage.  Pt complains of sever pelvic pain and bleeding since earlier this afternoon. G2P1001. No period since august , pos home preg test  Review of Systems  Positive: Vag bleeding, sever pain Negative: vomiting  Physical Exam  BP (!) 137/108   Pulse (!) 110   Temp 98.1 F (36.7 C) (Oral)   Resp 20   SpO2 100%  Gen:   Awake, appears to be in severe pain Resp:  Normal effort  MSK:   Moves extremities without difficulty  Other:  TTP pelvis  Medical Decision Making  Medically screening exam initiated at 10:42 PM.  Appropriate orders placed.  Norma Sullivan was informed that the remainder of the evaluation will be completed by another provider, this initial triage assessment does not replace that evaluation, and the importance of remaining in the ED until their evaluation is complete.  Concern for ectopic. Labs, imaging and pain control ordered.   Arthor Captain, PA-C 08/30/21 2247    Virgina Norfolk, DO 08/30/21 2319

## 2021-08-31 LAB — CBC WITH DIFFERENTIAL/PLATELET
Abs Immature Granulocytes: 0.04 10*3/uL (ref 0.00–0.07)
Basophils Absolute: 0 10*3/uL (ref 0.0–0.1)
Basophils Relative: 1 %
Eosinophils Absolute: 0.1 10*3/uL (ref 0.0–0.5)
Eosinophils Relative: 1 %
HCT: 42.1 % (ref 36.0–46.0)
Hemoglobin: 13.9 g/dL (ref 12.0–15.0)
Immature Granulocytes: 1 %
Lymphocytes Relative: 36 %
Lymphs Abs: 3.2 10*3/uL (ref 0.7–4.0)
MCH: 29.4 pg (ref 26.0–34.0)
MCHC: 33 g/dL (ref 30.0–36.0)
MCV: 89 fL (ref 80.0–100.0)
Monocytes Absolute: 0.7 10*3/uL (ref 0.1–1.0)
Monocytes Relative: 8 %
Neutro Abs: 4.7 10*3/uL (ref 1.7–7.7)
Neutrophils Relative %: 53 %
Platelets: 170 10*3/uL (ref 150–400)
RBC: 4.73 MIL/uL (ref 3.87–5.11)
RDW: 13.9 % (ref 11.5–15.5)
WBC: 8.7 10*3/uL (ref 4.0–10.5)
nRBC: 0 % (ref 0.0–0.2)

## 2021-08-31 LAB — WET PREP, GENITAL
Sperm: NONE SEEN
Trich, Wet Prep: NONE SEEN
WBC, Wet Prep HPF POC: NONE SEEN
Yeast Wet Prep HPF POC: NONE SEEN

## 2021-08-31 LAB — TYPE AND SCREEN
ABO/RH(D): AB POS
Antibody Screen: NEGATIVE

## 2021-08-31 LAB — URINALYSIS, ROUTINE W REFLEX MICROSCOPIC
Bilirubin Urine: NEGATIVE
Glucose, UA: NEGATIVE mg/dL
Ketones, ur: NEGATIVE mg/dL
Leukocytes,Ua: NEGATIVE
Nitrite: NEGATIVE
Protein, ur: NEGATIVE mg/dL
Specific Gravity, Urine: 1.012 (ref 1.005–1.030)
pH: 6 (ref 5.0–8.0)

## 2021-08-31 LAB — GC/CHLAMYDIA PROBE AMP (~~LOC~~) NOT AT ARMC
Chlamydia: NEGATIVE
Comment: NEGATIVE
Comment: NORMAL
Neisseria Gonorrhea: NEGATIVE

## 2021-08-31 LAB — ABO/RH: ABO/RH(D): AB POS

## 2021-08-31 MED ORDER — FENTANYL CITRATE PF 50 MCG/ML IJ SOSY
50.0000 ug | PREFILLED_SYRINGE | Freq: Once | INTRAMUSCULAR | Status: AC
  Administered 2021-08-31: 50 ug via INTRAVENOUS
  Filled 2021-08-31: qty 1

## 2021-08-31 MED ORDER — HYDROCODONE-ACETAMINOPHEN 5-325 MG PO TABS: 1.0000 | ORAL_TABLET | Freq: Four times a day (QID) | ORAL | 0 refills | Status: AC | PRN

## 2021-08-31 MED ORDER — ONDANSETRON HCL 4 MG/2ML IJ SOLN
4.0000 mg | Freq: Once | INTRAMUSCULAR | Status: AC
  Administered 2021-08-31: 4 mg via INTRAVENOUS
  Filled 2021-08-31: qty 2

## 2021-08-31 MED ORDER — KETOROLAC TROMETHAMINE 15 MG/ML IJ SOLN
15.0000 mg | Freq: Once | INTRAMUSCULAR | Status: AC
  Administered 2021-08-31: 15 mg via INTRAVENOUS
  Filled 2021-08-31: qty 1

## 2021-08-31 MED ORDER — OXYCODONE-ACETAMINOPHEN 5-325 MG PO TABS
2.0000 | ORAL_TABLET | Freq: Once | ORAL | Status: AC
  Administered 2021-08-31: 2 via ORAL
  Filled 2021-08-31: qty 2

## 2021-08-31 NOTE — Discharge Instructions (Addendum)
Take 600 mg ibuprofen every 6 hours for management of pain.  You may use Norco as prescribed for management of severe pain.  Go to [facility] for repeat blood testing on Thursday at 9 AM.  Should you experience worsening symptoms such as worsening pain, fever, or heavy vaginal bleeding (soaking through 1 or more pads per hour for more than 5 consecutive hours) go to [facility] at [facility] for repeat evaluation.

## 2021-08-31 NOTE — ED Notes (Signed)
Patient has no concerns after AVS has been reviewed and patient education provided. Patient discharged. 

## 2021-09-02 ENCOUNTER — Ambulatory Visit (INDEPENDENT_AMBULATORY_CARE_PROVIDER_SITE_OTHER): Payer: Medicaid Other | Admitting: General Practice

## 2021-09-02 ENCOUNTER — Other Ambulatory Visit: Payer: Self-pay

## 2021-09-02 VITALS — BP 110/63 | HR 75 | Ht 64.0 in | Wt 217.0 lb

## 2021-09-02 DIAGNOSIS — O3680X0 Pregnancy with inconclusive fetal viability, not applicable or unspecified: Secondary | ICD-10-CM

## 2021-09-02 LAB — BETA HCG QUANT (REF LAB): hCG Quant: 39 m[IU]/mL

## 2021-09-02 NOTE — Progress Notes (Signed)
Patient presents to office today for stat bhcg following up from ER visit on 10/3. Patient came into the ER with bleeding heavier than a period with significant pain. She states bleeding today is similar to a period and denies pain at this current time- patient does report tenderness in lower belly at times. Discussed with patient we are monitoring your bhcg levels today, results take approximately 2 hours to finalize and will be reviewed with a provider in the office. Advised we will then call you with results/updated plan of care. Patient verbalized understanding.  Reviewed results with Dr Debroah Loop who finds decreasing bhcg indicative of SAB, patient should have follow up bhcg in 1 week and a follow up provider visit.   Called patient and reviewed results with her. Offered appt for follow up bhcg on 10/14 & patient verbalized understanding. Told patient someone from the front office would call her for a follow up provider appt. Patient verbalized understanding.  Chase Caller RN BSN 09/02/21

## 2021-09-10 ENCOUNTER — Other Ambulatory Visit: Payer: Medicaid Other

## 2021-09-23 ENCOUNTER — Ambulatory Visit: Payer: Medicaid Other | Admitting: Obstetrics & Gynecology

## 2022-02-27 ENCOUNTER — Emergency Department (HOSPITAL_COMMUNITY)
Admission: EM | Admit: 2022-02-27 | Discharge: 2022-02-28 | Disposition: A | Discharge status: Home / Self Care | Payer: Medicaid Other | Attending: Emergency Medicine | Admitting: Emergency Medicine

## 2022-02-27 ENCOUNTER — Encounter (HOSPITAL_COMMUNITY): Payer: Self-pay | Admitting: Emergency Medicine

## 2022-02-27 ENCOUNTER — Other Ambulatory Visit: Payer: Self-pay

## 2022-02-27 DIAGNOSIS — I10 Essential (primary) hypertension: Secondary | ICD-10-CM | POA: Diagnosis not present

## 2022-02-27 DIAGNOSIS — Y92410 Unspecified street and highway as the place of occurrence of the external cause: Secondary | ICD-10-CM | POA: Diagnosis not present

## 2022-02-27 DIAGNOSIS — M25519 Pain in unspecified shoulder: Secondary | ICD-10-CM | POA: Diagnosis not present

## 2022-02-27 DIAGNOSIS — M62830 Muscle spasm of back: Secondary | ICD-10-CM | POA: Diagnosis not present

## 2022-02-27 DIAGNOSIS — M542 Cervicalgia: Secondary | ICD-10-CM | POA: Diagnosis not present

## 2022-02-27 DIAGNOSIS — M545 Low back pain, unspecified: Secondary | ICD-10-CM | POA: Diagnosis present
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
## 2022-02-28 ENCOUNTER — Emergency Department (HOSPITAL_COMMUNITY): Payer: Medicaid Other

## 2022-02-28 LAB — PREGNANCY, URINE: Preg Test, Ur: NEGATIVE

## 2022-02-28 MED ORDER — ACETAMINOPHEN 325 MG PO TABS
650.0000 mg | ORAL_TABLET | Freq: Once | ORAL | Status: DC
  Administered 2022-02-28: 650 mg via ORAL
  Filled 2022-02-28: qty 2

## 2022-02-28 MED ORDER — METHOCARBAMOL 500 MG PO TABS: 500.0000 mg | ORAL_TABLET | Freq: Two times a day (BID) | ORAL | 0 refills | Status: AC

## 2022-02-28 MED ORDER — IBUPROFEN 400 MG PO TABS: 600.0000 mg | ORAL_TABLET | Freq: Once | ORAL | Status: DC

## 2022-02-28 MED ORDER — LIDOCAINE 5 % EX PTCH
2.0000 | MEDICATED_PATCH | CUTANEOUS | Status: DC
  Administered 2022-02-28: 2 via TRANSDERMAL
  Filled 2022-02-28: qty 2
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
## 2022-02-28 NOTE — Discharge Instructions (Signed)
You were seen in the emergency department today for your neck and back pain.  Your physical exam and vital signs are very reassuring. ? ?The muscles in your neck and back are in what is called spasm, meaning they are inappropriately tightened up.  This can be quite painful.  To help with your pain you may take Tylenol and / or NSAID medication (such as ibuprofen or naproxen) to help with your pain.  Additionally, you have been prescribed a muscle relaxer called Robaxin to help relieve some of the muscle spasm.  Please be advised that this medication may make you very sleepy, so you should not drive or operate heavy machinery while you are taking it. ? ?You may also utilize topical pain relief such as Biofreeze, IcyHot, or topical lidocaine patches.  I also recommend that you apply heat to the area, such as a hot shower or heating pad, and follow heat application with massage of the muscles that are most tight. ? ?Please return to the emergency department if you develop any numbness/tingling/weakness in your arms or legs, any difficulty urinating, or urinary incontinence chest pain, shortness of breath, abdominal pain, nausea or vomiting that does not stop, or any other new severe symptoms. ? ?

## 2022-04-06 ENCOUNTER — Ambulatory Visit: Payer: No Typology Code available for payment source

## 2022-04-06 NOTE — Therapy (Incomplete)
?OUTPATIENT PHYSICAL THERAPY THORACOLUMBAR EVALUATION ? ? ?Patient Name: Norma Sullivan ?MRN: [id number] ?DOB:09-Nov-1986, 36 y.o., female ?Today's Date: 04/06/2022 ? ? ? ?No past medical history on file. ?Past Surgical History:  ?Procedure Laterality Date  ? CESAREAN SECTION    ? COSMETIC SURGERY    ? ?Patient Active Problem List  ? Diagnosis Date Noted  ? Abnormal Pap smear of cervix 10/31/2014  ? ? ?PCP: *** ? ?REFERRING PROVIDER:  ?Levonne Lapping, NP ? ?REFERRING DIAG:  ?M54.2 (ICD-10-CM) - Cervicalgia ?M54.50 (ICD-10-CM) - Low back pain, unspecified ? ?THERAPY DIAG:  ?No diagnosis found. ? ?ONSET DATE: *** ? ?SUBJECTIVE:                                                                                                                                                                                          ? ?SUBJECTIVE STATEMENT: ?*** ?PERTINENT HISTORY:  ?*** ? ?PAIN:  ?Are you having pain? Yes: {yespain:27235::"NPRS scale: ***/10","Pain location: ***","Pain description: ***","Aggravating factors: ***","Relieving factors: ***"} ? ? ?PRECAUTIONS: {Therapy precautions:24002} ? ?WEIGHT BEARING RESTRICTIONS {Yes ***/No:24003} ? ?FALLS:  ?Has patient fallen in last 6 months? {fallsyesno:27318} ? ?LIVING ENVIRONMENT: ?Lives with: {[facility] lives with:25569::"lives with their family"} ?Lives in: {Lives in:25570} ?Stairs: {opstairs:27293} ?Has following equipment at home: {Assistive devices:23999} ? ?OCCUPATION: *** ? ?PLOF: {PLOF:24004} ? ?PATIENT GOALS *** ? ? ?OBJECTIVE:  ? ?DIAGNOSTIC FINDINGS:  ?*** ? ?PATIENT SURVEYS:  ?{rehab surveys:24030} ? ?SCREENING FOR RED FLAGS: ?Bowel or bladder incontinence: {Yes/No:304960894} ?Spinal tumors: {Yes/No:304960894} ?Cauda equina syndrome: {Yes/No:304960894} ?Compression fracture: {Yes/No:304960894} ?Abdominal aneurysm: {Yes/No:304960894} ? ?COGNITION: ? Overall cognitive status: {cognition:24006}   ?  ?SENSATION: ?{sensation:27233} ? ?MUSCLE LENGTH: ?Hamstrings: Right *** deg;  Left *** deg ?Thomas test: Right *** deg; Left *** deg ? ?POSTURE:  ?*** ? ?PALPATION: ?*** ? ?LUMBAR ROM:  ? ?{AROM/PROM:27142}  A/PROM  ?04/06/2022  ?Flexion   ?Extension   ?Right lateral flexion   ?Left lateral flexion   ?Right rotation   ?Left rotation   ? (Blank rows = not tested) ? ?LE ROM: ? ?{AROM/PROM:27142}  Right ?04/06/2022 Left ?04/06/2022  ?Hip flexion    ?Hip extension    ?Hip abduction    ?Hip adduction    ?Hip internal rotation    ?Hip external rotation    ?Knee flexion    ?Knee extension    ?Ankle dorsiflexion    ?Ankle plantarflexion    ?Ankle inversion    ?Ankle eversion    ? (Blank rows = not tested) ? ?LE MMT: ? ?MMT Right ?04/06/2022 Left ?04/06/2022  ?Hip flexion    ?Hip extension    ?Hip abduction    ?Hip adduction    ?  Hip internal rotation    ?Hip external rotation    ?Knee flexion    ?Knee extension    ?Ankle dorsiflexion    ?Ankle plantarflexion    ?Ankle inversion    ?Ankle eversion    ? (Blank rows = not tested) ? ?LUMBAR SPECIAL TESTS:  ?{lumbar special test:25242} ? ?FUNCTIONAL TESTS:  ?{Functional tests:24029} ? ?GAIT: ?Distance walked: *** ?Assistive device utilized: {Assistive devices:23999} ?Level of assistance: {Levels of assistance:24026} ?Comments: *** ? ? ? ?TODAY'S TREATMENT  ?*** ? ? ?PATIENT EDUCATION:  ?Education details: *** ?Person educated: {Person educated:25204} ?Education method: {Education Method:25205} ?Education comprehension: {Education Comprehension:25206} ? ? ?HOME EXERCISE PROGRAM: ?*** ? ?ASSESSMENT: ? ?CLINICAL IMPRESSION: ?Patient is a *** y.o. *** who was seen today for physical therapy evaluation and treatment for ***.  ? ? ?OBJECTIVE IMPAIRMENTS {opptimpairments:25111}.  ? ?ACTIVITY LIMITATIONS {activity limitations:25113}.  ? ?PERSONAL FACTORS {Personal factors:25162} are also affecting patient's functional outcome.  ? ? ?REHAB POTENTIAL: {rehabpotential:25112} ? ?CLINICAL DECISION MAKING: {clinical decision making:25114} ? ?EVALUATION COMPLEXITY:  {Evaluation complexity:25115} ? ? ?GOALS: ?Goals reviewed with patient? {yes/no:20286} ? ?SHORT TERM GOALS: Target date: {follow up:25551}    (Remove Blue Hyperlink) ? ?*** ?Baseline: ?Goal status: {GOALSTATUS:25110} ? ?2.  *** ?Baseline:  ?Goal status: {GOALSTATUS:25110} ? ?3.  *** ?Baseline:  ?Goal status: {GOALSTATUS:25110} ? ?4.  *** ?Baseline:  ?Goal status: {GOALSTATUS:25110} ? ?5.  *** ?Baseline:  ?Goal status: {GOALSTATUS:25110} ? ?6.  *** ?Baseline:  ?Goal status: {GOALSTATUS:25110} ? ?LONG TERM GOALS: Target date: {follow up:25551}  (Remove Blue Hyperlink) ? ?*** ?Baseline:  ?Goal status: {GOALSTATUS:25110} ? ?2.  *** ?Baseline:  ?Goal status: {GOALSTATUS:25110} ? ?3.  *** ?Baseline:  ?Goal status: {GOALSTATUS:25110} ? ?4.  *** ?Baseline:  ?Goal status: {GOALSTATUS:25110} ? ?5.  *** ?Baseline:  ?Goal status: {GOALSTATUS:25110} ? ?6.  *** ?Baseline:  ?Goal status: {GOALSTATUS:25110} ? ? ?PLAN: ?PT FREQUENCY: {rehab frequency:25116} ? ?PT DURATION: {rehab duration:25117} ? ?PLANNED INTERVENTIONS: {rehab planned interventions:25118::"Therapeutic exercises","Therapeutic activity","Neuromuscular re-education","Balance training","Gait training","Patient/Family education","Joint mobilization"}. ? ?PLAN FOR NEXT SESSION: *** ? ? ?Eloy End, PT ?04/06/2022, 9:13 AM  ?

## 2022-08-27 ENCOUNTER — Inpatient Hospital Stay: Admission: RE | Admit: 2022-08-27 | Payer: Medicaid Other | Source: Ambulatory Visit

## 2022-08-27 ENCOUNTER — Other Ambulatory Visit: Payer: Self-pay | Admitting: Internal Medicine

## 2022-08-27 DIAGNOSIS — Z1231 Encounter for screening mammogram for malignant neoplasm of breast: Secondary | ICD-10-CM

## 2023-10-25 ENCOUNTER — Other Ambulatory Visit (HOSPITAL_COMMUNITY): Payer: Self-pay | Admitting: Physician Assistant

## 2023-10-25 DIAGNOSIS — G8929 Other chronic pain: Secondary | ICD-10-CM

## 2023-10-25 DIAGNOSIS — R109 Unspecified abdominal pain: Secondary | ICD-10-CM

## 2023-11-01 ENCOUNTER — Ambulatory Visit (HOSPITAL_COMMUNITY): Payer: Medicaid Other

## 2023-11-01 ENCOUNTER — Encounter (HOSPITAL_COMMUNITY): Payer: Self-pay

## 2023-11-03 ENCOUNTER — Encounter (HOSPITAL_COMMUNITY): Payer: Self-pay

## 2023-11-03 ENCOUNTER — Ambulatory Visit (HOSPITAL_COMMUNITY): Payer: Medicaid Other | Attending: Physician Assistant

## 2023-12-19 ENCOUNTER — Ambulatory Visit (HOSPITAL_COMMUNITY)
Admission: RE | Admit: 2023-12-19 | Discharge: 2023-12-19 | Disposition: A | Discharge status: Home / Self Care | Payer: Medicaid Other | Source: Ambulatory Visit | Attending: Physician Assistant | Admitting: Physician Assistant

## 2023-12-19 DIAGNOSIS — G8929 Other chronic pain: Secondary | ICD-10-CM | POA: Diagnosis present

## 2023-12-19 DIAGNOSIS — R109 Unspecified abdominal pain: Secondary | ICD-10-CM | POA: Insufficient documentation

## 2023-12-19 MED ORDER — IOHEXOL 300 MG/ML  SOLN
100.0000 mL | Freq: Once | INTRAMUSCULAR | Status: AC | PRN
  Administered 2023-12-19: 100 mL via INTRAVENOUS

## 2024-01-25 NOTE — Progress Notes (Deleted)
 01/25/2024 AROURA VASUDEVAN [id number] 12/31/1985  Referring provider: [facility] Primary GI doctor: {acdocs:27040}  ASSESSMENT AND PLAN:   Assessment and Plan              Patient Care Team: [facility] as PCP - General (Pediatrics)  HISTORY OF PRESENT ILLNESS: 38 y.o. female with a past medical history of anxiety, chronic abdominal pain, hepatic steatosis, cholelithiasis, fat-containing umbilical hernia and others listed below presents for evaluation of chronic abdominal pain.   Discussed the use of AI scribe software for clinical note transcription with the patient, who gave verbal consent to proceed.  History of Present Illness            She  reports that she has quit smoking. She has never used smokeless tobacco. She reports current alcohol use. She reports that she does not use drugs.  RELEVANT GI HISTORY, IMAGING AND LABS: 12/19/2023 CT abdomen pelvis with contrast for chronic intermittent abdominal pain for 2 years IMPRESSION: 1. No acute abnormality in the abdomen or pelvis. 2. Diffuse hepatic steatosis. 3. Cholelithiasis. 4. Fat containing umbilical hernia. CBC    Component Value Date/Time   WBC 8.7 08/30/2021 0029   RBC 4.73 08/30/2021 0029   HGB 13.9 08/30/2021 0029   HCT 42.1 08/30/2021 0029   PLT 170 08/30/2021 0029   MCV 89.0 08/30/2021 0029   MCH 29.4 08/30/2021 0029   MCHC 33.0 08/30/2021 0029   RDW 13.9 08/30/2021 0029   LYMPHSABS 3.2 08/30/2021 0029   MONOABS 0.7 08/30/2021 0029   EOSABS 0.1 08/30/2021 0029   BASOSABS 0.0 08/30/2021 0029   No results for input(s): "HGB" in the last 8760 hours.  CMP     Component Value Date/Time   NA 137 08/30/2021 2329   K 3.8 08/30/2021 2329   CL 103 08/30/2021 2329   CO2 25 08/30/2021 2329   GLUCOSE 100 (H) 08/30/2021 2329   BUN 12 08/30/2021 2329   CREATININE 0.84 08/30/2021 2329   CALCIUM 9.4 08/30/2021 2329   PROT 7.8 08/30/2021 2329    ALBUMIN 4.5 08/30/2021 2329   AST 24 08/30/2021 2329   ALT 27 08/30/2021 2329   ALKPHOS 65 08/30/2021 2329   BILITOT 0.8 08/30/2021 2329   GFRNONAA >60 08/30/2021 2329      Latest Ref Rng & Units 08/30/2021   11:29 PM  Hepatic Function  Total Protein 6.5 - 8.1 g/dL 7.8   Albumin 3.5 - 5.0 g/dL 4.5   AST 15 - 41 U/L 24   ALT 0 - 44 U/L 27   Alk Phosphatase 38 - 126 U/L 65   Total Bilirubin 0.3 - 1.2 mg/dL 0.8       Current Medications:      Current Outpatient Medications (Analgesics):    HYDROcodone-acetaminophen (NORCO/VICODIN) 5-325 MG tablet, Take 1-2 tablets by mouth every 6 (six) hours as needed for severe pain.   Current Outpatient Medications (Other):    methocarbamol (ROBAXIN) 500 MG tablet, Take 1 tablet (500 mg total) by mouth 2 (two) times daily.  Medical History: No past medical history on file. Allergies: No Known Allergies   Surgical History:  She  has a past surgical history that includes Cesarean section and Cosmetic surgery. Family History:  Her family history includes COPD in her mother.  REVIEW OF SYSTEMS  : All other systems reviewed and negative except where noted in the History of Present Illness.  PHYSICAL EXAM: There were no  vitals taken for this visit. Physical Exam          Doree Albee, PA-C 10:23 AM

## 2024-01-26 ENCOUNTER — Ambulatory Visit: Payer: Medicaid Other | Admitting: Physician Assistant
